# Patient Record
Sex: Female | Born: 1990 | Race: White | Hispanic: No | Marital: Single | Smoking: Never smoker
Health system: Southern US, Community
[De-identification: ages and names within clinical notes are randomized; demographics above are authoritative.]

## PROBLEM LIST (undated history)

## (undated) ENCOUNTER — Inpatient Hospital Stay (HOSPITAL_COMMUNITY): Payer: Self-pay

## (undated) DIAGNOSIS — O99820 Streptococcus B carrier state complicating pregnancy: Secondary | ICD-10-CM

## (undated) DIAGNOSIS — B009 Herpesviral infection, unspecified: Secondary | ICD-10-CM

## (undated) DIAGNOSIS — R51 Headache: Secondary | ICD-10-CM

## (undated) DIAGNOSIS — R519 Headache, unspecified: Secondary | ICD-10-CM

## (undated) HISTORY — PX: TUBAL LIGATION: SHX77

---
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
## 2010-03-18 ENCOUNTER — Ambulatory Visit: Payer: Self-pay | Admitting: Gynecology

## 2010-03-18 ENCOUNTER — Inpatient Hospital Stay (HOSPITAL_COMMUNITY): Admission: AD | Admit: 2010-03-18 | Discharge: 2010-03-18 | Payer: Self-pay | Admitting: Obstetrics & Gynecology

## 2010-06-21 ENCOUNTER — Observation Stay (HOSPITAL_COMMUNITY): Admission: AD | Admit: 2010-06-21 | Discharge: 2010-06-22 | Payer: Self-pay | Admitting: Obstetrics and Gynecology

## 2010-08-06 ENCOUNTER — Ambulatory Visit: Payer: Self-pay | Admitting: Obstetrics and Gynecology

## 2010-08-06 ENCOUNTER — Inpatient Hospital Stay (HOSPITAL_COMMUNITY): Admission: AD | Admit: 2010-08-06 | Discharge: 2010-08-08 | Payer: Self-pay | Admitting: Obstetrics and Gynecology

## 2010-08-06 ENCOUNTER — Encounter (INDEPENDENT_AMBULATORY_CARE_PROVIDER_SITE_OTHER): Payer: Self-pay | Admitting: Obstetrics and Gynecology

## 2010-08-16 ENCOUNTER — Inpatient Hospital Stay (HOSPITAL_COMMUNITY): Admission: AD | Admit: 2010-08-16 | Discharge: 2010-08-16 | Payer: Self-pay | Admitting: Obstetrics and Gynecology

## 2010-10-24 ENCOUNTER — Encounter: Payer: Self-pay | Admitting: Obstetrics and Gynecology

## 2010-12-14 LAB — COMPREHENSIVE METABOLIC PANEL
ALT: 27 U/L (ref 0–35)
Alkaline Phosphatase: 75 U/L (ref 39–117)
BUN: 6 mg/dL (ref 6–23)
Chloride: 105 mEq/L (ref 96–112)
GFR calc Af Amer: >60 mL/min (ref >60)
Potassium: 3.4 mEq/L — ABNORMAL LOW (ref 3.5–5.1)
Total Bilirubin: 0.1 mg/dL — ABNORMAL LOW (ref 0.3–1.2)

## 2010-12-14 LAB — CBC
HCT: 29.2 % — ABNORMAL LOW (ref 36.0–46.0)
Hemoglobin: 10.1 g/dL — ABNORMAL LOW (ref 12.0–15.0)
Hemoglobin: 11.4 g/dL — ABNORMAL LOW (ref 12.0–15.0)
Hemoglobin: 9.8 g/dL — ABNORMAL LOW (ref 12.0–15.0)
MCH: 31.3 pg (ref 26.0–34.0)
MCH: 31.6 pg (ref 26.0–34.0)
MCHC: 34.2 g/dL (ref 30.0–36.0)
MCV: 92.2 fL (ref 78.0–100.0)
MCV: 93.1 fL (ref 78.0–100.0)
Platelets: 282 10*3/uL (ref 150–400)
RBC: 3.12 MIL/uL — ABNORMAL LOW (ref 3.87–5.11)
RBC: 3.65 MIL/uL — ABNORMAL LOW (ref 3.87–5.11)
RDW: 12.6 % (ref 11.5–15.5)
WBC: 13.3 10*3/uL — ABNORMAL HIGH (ref 4.0–10.5)
WBC: 14.7 10*3/uL — ABNORMAL HIGH (ref 4.0–10.5)

## 2010-12-14 LAB — URINALYSIS, ROUTINE W REFLEX MICROSCOPIC
Bilirubin Urine: NEGATIVE
Ketones, ur: NEGATIVE mg/dL
Nitrite: NEGATIVE
Protein, ur: NEGATIVE mg/dL
Specific Gravity, Urine: 1.02 (ref 1.005–1.030)
Urobilinogen, UA: 0.2 mg/dL (ref 0.0–1.0)

## 2010-12-14 LAB — RPR: RPR Ser Ql: NONREACTIVE

## 2010-12-14 LAB — URIC ACID: Uric Acid, Serum: 3.4 mg/dL (ref 2.4–7.0)

## 2010-12-14 LAB — WET PREP, GENITAL

## 2010-12-14 LAB — URINE MICROSCOPIC-ADD ON

## 2010-12-16 LAB — DIFFERENTIAL
Eosinophils Absolute: 0.2 10*3/uL (ref 0.0–0.7)
Eosinophils Relative: 2 % (ref 0–5)
Lymphocytes Relative: 20 % (ref 12–46)
Monocytes Absolute: 0.8 10*3/uL (ref 0.1–1.0)
Neutro Abs: 9 10*3/uL — ABNORMAL HIGH (ref 1.7–7.7)

## 2010-12-16 LAB — WET PREP, GENITAL
Clue Cells Wet Prep HPF POC: NONE SEEN
Trich, Wet Prep: NONE SEEN

## 2010-12-16 LAB — CBC
HCT: 29.2 % — ABNORMAL LOW (ref 36.0–46.0)
MCHC: 34.3 g/dL (ref 30.0–36.0)
Platelets: 290 10*3/uL (ref 150–400)
RDW: 12.6 % (ref 11.5–15.5)
WBC: 12.6 10*3/uL — ABNORMAL HIGH (ref 4.0–10.5)

## 2010-12-16 LAB — HERPES SIMPLEX VIRUS CULTURE

## 2010-12-19 LAB — WET PREP, GENITAL

## 2010-12-20 LAB — URINE MICROSCOPIC-ADD ON

## 2010-12-20 LAB — GC/CHLAMYDIA PROBE AMP, GENITAL: GC Probe Amp, Genital: NEGATIVE

## 2010-12-20 LAB — CBC
HCT: 37.2 % (ref 36.0–46.0)
Platelets: 362 10*3/uL (ref 150–400)
WBC: 9.7 10*3/uL (ref 4.0–10.5)

## 2010-12-20 LAB — URINALYSIS, ROUTINE W REFLEX MICROSCOPIC
Glucose, UA: NEGATIVE mg/dL
Ketones, ur: NEGATIVE mg/dL
Leukocytes, UA: NEGATIVE
pH: 8 (ref 5.0–8.0)

## 2010-12-20 LAB — WET PREP, GENITAL
Trich, Wet Prep: NONE SEEN
Yeast Wet Prep HPF POC: NONE SEEN

## 2010-12-20 LAB — HCG, QUANTITATIVE, PREGNANCY: hCG, Beta Chain, Quant, S: 2867 m[IU]/mL — ABNORMAL HIGH (ref <5)

## 2010-12-20 LAB — POCT PREGNANCY, URINE: Preg Test, Ur: POSITIVE

## 2011-04-22 ENCOUNTER — Emergency Department (HOSPITAL_COMMUNITY): Payer: Self-pay

## 2011-04-22 ENCOUNTER — Emergency Department (HOSPITAL_COMMUNITY)
Admission: EM | Admit: 2011-04-22 | Discharge: 2011-04-22 | Disposition: A | Discharge status: Home / Self Care | Payer: Self-pay | Attending: Emergency Medicine | Admitting: Emergency Medicine

## 2011-04-22 DIAGNOSIS — R10814 Left lower quadrant abdominal tenderness: Secondary | ICD-10-CM | POA: Insufficient documentation

## 2011-04-22 DIAGNOSIS — R1032 Left lower quadrant pain: Secondary | ICD-10-CM | POA: Insufficient documentation

## 2011-04-22 LAB — URINALYSIS, ROUTINE W REFLEX MICROSCOPIC
Glucose, UA: NEGATIVE mg/dL
Specific Gravity, Urine: 1.028 (ref 1.005–1.030)
pH: 6 (ref 5.0–8.0)

## 2011-04-22 LAB — URINE MICROSCOPIC-ADD ON

## 2012-01-29 ENCOUNTER — Emergency Department (HOSPITAL_COMMUNITY)
Admission: EM | Admit: 2012-01-29 | Discharge: 2012-01-29 | Disposition: A | Discharge status: Home / Self Care | Payer: Commercial Managed Care - PPO | Attending: Emergency Medicine | Admitting: Emergency Medicine

## 2012-01-29 ENCOUNTER — Encounter (HOSPITAL_COMMUNITY): Payer: Self-pay | Admitting: Emergency Medicine

## 2012-01-29 DIAGNOSIS — R197 Diarrhea, unspecified: Secondary | ICD-10-CM | POA: Insufficient documentation

## 2012-01-29 DIAGNOSIS — R109 Unspecified abdominal pain: Secondary | ICD-10-CM | POA: Insufficient documentation

## 2012-01-29 DIAGNOSIS — K5289 Other specified noninfective gastroenteritis and colitis: Secondary | ICD-10-CM | POA: Insufficient documentation

## 2012-01-29 DIAGNOSIS — K529 Noninfective gastroenteritis and colitis, unspecified: Secondary | ICD-10-CM

## 2012-01-29 LAB — POCT I-STAT, CHEM 8
BUN: 12 mg/dL (ref 6–23)
Calcium, Ion: 1.19 mmol/L (ref 1.12–1.32)
Chloride: 106 mEq/L (ref 96–112)
Creatinine, Ser: 0.8 mg/dL (ref 0.50–1.10)
Sodium: 141 mEq/L (ref 135–145)
TCO2: 23 mmol/L (ref 0–100)

## 2012-01-29 LAB — URINALYSIS, ROUTINE W REFLEX MICROSCOPIC
Glucose, UA: NEGATIVE mg/dL
Protein, ur: NEGATIVE mg/dL

## 2012-01-29 LAB — URINE MICROSCOPIC-ADD ON

## 2012-01-29 MED ORDER — ONDANSETRON HCL 4 MG/2ML IJ SOLN
4.0000 mg | Freq: Once | INTRAMUSCULAR | Status: AC
  Administered 2012-01-29: 4 mg via INTRAVENOUS
  Filled 2012-01-29: qty 2

## 2012-01-29 MED ORDER — ONDANSETRON 8 MG PO TBDP
8.0000 mg | ORAL_TABLET | Freq: Once | ORAL | Status: AC
  Administered 2012-01-29: 8 mg via ORAL
  Filled 2012-01-29: qty 1

## 2012-01-29 MED ORDER — PANTOPRAZOLE SODIUM 40 MG IV SOLR
40.0000 mg | Freq: Once | INTRAVENOUS | Status: AC
Start: 2012-01-29 — End: 2012-01-29
  Administered 2012-01-29: 40 mg via INTRAVENOUS
  Filled 2012-01-29: qty 40

## 2012-01-29 MED ORDER — SODIUM CHLORIDE 0.9 % IV BOLUS (SEPSIS)
1000.0000 mL | Freq: Once | INTRAVENOUS | Status: AC
  Administered 2012-01-29: 1000 mL via INTRAVENOUS

## 2012-01-29 MED ORDER — SODIUM CHLORIDE 0.9 % IV SOLN: INTRAVENOUS | Status: DC

## 2012-01-29 MED ORDER — ONDANSETRON 8 MG PO TBDP: 8.0000 mg | ORAL_TABLET | Freq: Three times a day (TID) | ORAL | Status: AC | PRN

## 2012-01-29 MED ORDER — FENTANYL CITRATE 0.05 MG/ML IJ SOLN
50.0000 ug | Freq: Once | INTRAMUSCULAR | Status: AC
  Administered 2012-01-29: 50 ug via INTRAVENOUS
  Filled 2012-01-29: qty 2

## 2012-01-29 MED ORDER — TRAMADOL HCL 50 MG PO TABS: 50.0000 mg | ORAL_TABLET | Freq: Four times a day (QID) | ORAL | Status: AC | PRN

## 2012-01-29 MED ORDER — GI COCKTAIL ~~LOC~~
30.0000 mL | Freq: Once | ORAL | Status: AC
  Administered 2012-01-29: 30 mL via ORAL
  Filled 2012-01-29: qty 30

## 2012-01-29 NOTE — ED Provider Notes (Signed)
History     CSN: [id number]  Arrival date & time 01/29/12  0434   First MD Initiated Contact with Patient 01/29/12 0607      6:27 AM HPI Alicia Singleton is a 21 y.o. female complaining of NVD that began 3 days ago. Reports within the last 24 hours has been unable to tolerate any PO. States this morning began having associated mild LUQ abdominal pain. Denies fevers, dysuria, hematuria, frequency, urgency, hematochezia, vaginal discharge, back pain, headache. Reports her child has similar symptoms.   Patient is a 21 y.o. female presenting with vomiting. The history is provided by the patient.  Emesis  This is a new problem. The current episode started 2 days ago. The problem has been gradually worsening. The emesis has an appearance of stomach contents and bilious material. There has been no fever. Associated symptoms include abdominal pain and diarrhea. Pertinent negatives include no chills, no cough, no fever, no headaches, no myalgias and no URI. Risk factors include ill contacts.    History reviewed. No pertinent past medical history.  History reviewed. No pertinent past surgical history.  Family History  Problem Relation Age of Onset  . Hyperlipidemia Father   . Diabetes Other   . Hyperlipidemia Other   . Heart failure Other     History  Substance Use Topics  . Smoking status: Not on file  . Smokeless tobacco: Not on file  . Alcohol Use:     OB History    Grav Para Term Preterm Abortions TAB SAB Ect Mult Living                  Review of Systems  Constitutional: Negative for fever and chills.  Respiratory: Negative for cough and shortness of breath.   Cardiovascular: Negative for chest pain.  Gastrointestinal: Positive for vomiting, abdominal pain and diarrhea. Negative for nausea, constipation and blood in stool.  Genitourinary: Negative for dysuria, urgency, frequency, hematuria, flank pain, vaginal discharge and vaginal pain.  Musculoskeletal: Negative for  myalgias and back pain.  Neurological: Negative for headaches.  All other systems reviewed and are negative.    Allergies  Review of patient's allergies indicates no known allergies.  Home Medications  No current outpatient prescriptions on file.  BP 126/81  Temp(Src) 98.2 F (36.8 C) (Oral)  Resp 18  SpO2 96%  Physical Exam  Vitals reviewed. Constitutional: She is oriented to person, place, and time. Vital signs are normal. She appears well-developed and well-nourished.  HENT:  Head: Normocephalic and atraumatic.  Mouth/Throat: Oropharynx is clear and moist. No oropharyngeal exudate.  Eyes: Conjunctivae are normal. Pupils are equal, round, and reactive to light.  Neck: Normal range of motion. Neck supple.  Cardiovascular: Normal rate, regular rhythm and normal heart sounds.  Exam reveals no friction rub.   No murmur heard. Pulmonary/Chest: Effort normal and breath sounds normal. She has no wheezes. She has no rhonchi. She has no rales. She exhibits no tenderness.  Abdominal: Soft. Bowel sounds are normal. She exhibits no distension and no mass. There is no tenderness. There is no rebound and no guarding.  Neurological: She is alert and oriented to person, place, and time.  Skin: Skin is warm and dry. No rash noted. No erythema. No pallor.  Psychiatric: She has a normal mood and affect. Her behavior is normal.    ED Course  Procedures  Results for orders placed during the hospital encounter of 01/29/12  PREGNANCY, URINE      Component Value  Range   Preg Test, Ur NEGATIVE  NEGATIVE   URINALYSIS, ROUTINE W REFLEX MICROSCOPIC      Component Value Range   Color, Urine YELLOW  YELLOW    APPearance CLOUDY (*) CLEAR    Specific Gravity, Urine 1.029  1.005 - 1.030    pH 6.5  5.0 - 8.0    Glucose, UA NEGATIVE  NEGATIVE (mg/dL)   Hgb urine dipstick TRACE (*) NEGATIVE    Bilirubin Urine SMALL (*) NEGATIVE    Ketones, ur TRACE (*) NEGATIVE (mg/dL)   Protein, ur NEGATIVE   NEGATIVE (mg/dL)   Urobilinogen, UA 1.0  0.0 - 1.0 (mg/dL)   Nitrite NEGATIVE  NEGATIVE    Leukocytes, UA SMALL (*) NEGATIVE   POCT I-STAT, CHEM 8      Component Value Range   Sodium 141  135 - 145 (mEq/L)   Potassium 3.3 (*) 3.5 - 5.1 (mEq/L)   Chloride 106  96 - 112 (mEq/L)   BUN 12  6 - 23 (mg/dL)   Creatinine, Ser 4.09  0.50 - 1.10 (mg/dL)   Glucose, Bld 93  70 - 99 (mg/dL)   Calcium, Ion 8.11  9.14 - 1.32 (mmol/L)   TCO2 23  0 - 100 (mmol/L)   Hemoglobin 14.3  12.0 - 15.0 (g/dL)   HCT 78.2  95.6 - 21.3 (%)  URINE MICROSCOPIC-ADD ON      Component Value Range   Squamous Epithelial / LPF MANY (*) RARE    WBC, UA 11-20  <3 (WBC/hpf)   Bacteria, UA MANY (*) RARE    Urine-Other MUCOUS PRESENT      MDM  9:00 AM Patient reexamined. Is able to tolerate PO fluids. Patient still complaining of left upper quadrant pain which was not relieved by Protonix and GI cocktail. Will give of fentanyl. Patient does however continue the nontender with palpation. Discussed with patient and mother that we'll discharge her with antiemetics and pain medication. As well as instructions for diet. Patient likely has a viral gastroenteritis that she got from her child. Advised return for worsening symptoms such as intractable nausea vomiting or worsening pain. Patient voices understanding of discharge       Thomasene Lot, Cordelia Poche 01/29/12 0865

## 2012-01-29 NOTE — ED Notes (Addendum)
Pt c/o NVD that began Saturday morning. Pt states she has been unable to keep and fluids or solid down. Pt also c/o left sided abdominal pain that began at 230am this morning. Pt states she attempted to take OTC meds but couldn't keep them down.

## 2012-01-29 NOTE — Discharge Instructions (Signed)
B.R.A.T. Diet Your doctor has recommended the B.R.A.T. diet for you or your child until the condition improves. This is often used to help control diarrhea and vomiting symptoms. If you or your child can tolerate clear liquids, you may have:  Bananas.   Rice.   Applesauce.   Toast (and other simple starches such as crackers, potatoes, noodles).  Be sure to avoid dairy products, meats, and fatty foods until symptoms are better. Fruit juices such as apple, grape, and prune juice can make diarrhea worse. Avoid these. Continue this diet for 2 days or as instructed by your caregiver. Document Released: 09/19/2005 Document Revised: 09/08/2011 Document Reviewed: 03/08/2007 [employer] Patient Information 2012 [employer].Viral Gastroenteritis Viral gastroenteritis is also known as stomach flu. This condition affects the stomach and intestinal tract. It can cause sudden diarrhea and vomiting. The illness typically lasts 3 to 8 days. Most people develop an immune response that eventually gets rid of the virus. While this natural response develops, the virus can make you quite ill. CAUSES  Many different viruses can cause gastroenteritis, such as rotavirus or noroviruses. You can catch one of these viruses by consuming contaminated food or water. You may also catch a virus by sharing utensils or other personal items with an infected person or by touching a contaminated surface. SYMPTOMS  The most common symptoms are diarrhea and vomiting. These problems can cause a severe loss of body fluids (dehydration) and a body salt (electrolyte) imbalance. Other symptoms may include:  Fever.   Headache.   Fatigue.   Abdominal pain.  DIAGNOSIS  Your caregiver can usually diagnose viral gastroenteritis based on your symptoms and a physical exam. A stool sample may also be taken to test for the presence of viruses or other infections. TREATMENT  This illness typically goes away on its own. Treatments are aimed  at rehydration. The most serious cases of viral gastroenteritis involve vomiting so severely that you are not able to keep fluids down. In these cases, fluids must be given through an intravenous line (IV). HOME CARE INSTRUCTIONS   Drink enough fluids to keep your urine clear or pale yellow. Drink small amounts of fluids frequently and increase the amounts as tolerated.   Ask your caregiver for specific rehydration instructions.   Avoid:   Foods high in sugar.   Alcohol.   Carbonated drinks.   Tobacco.   Juice.   Caffeine drinks.   Extremely hot or cold fluids.   Fatty, greasy foods.   Too much intake of anything at one time.   Dairy products until 24 to 48 hours after diarrhea stops.   You may consume probiotics. Probiotics are active cultures of beneficial bacteria. They may lessen the amount and number of diarrheal stools in adults. Probiotics can be found in yogurt with active cultures and in supplements.   Wash your hands well to avoid spreading the virus.   Only take over-the-counter or prescription medicines for pain, discomfort, or fever as directed by your caregiver. Do not give aspirin to children. Antidiarrheal medicines are not recommended.   Ask your caregiver if you should continue to take your regular prescribed and over-the-counter medicines.   Keep all follow-up appointments as directed by your caregiver.  SEEK IMMEDIATE MEDICAL CARE IF:   You are unable to keep fluids down.   You do not urinate at least once every 6 to 8 hours.   You develop shortness of breath.   You notice blood in your stool or vomit. This may   look like coffee grounds.   You have abdominal pain that increases or is concentrated in one small area (localized).   You have persistent vomiting or diarrhea.   You have a fever.   The patient is a child younger than 3 months, and he or she has a fever.   The patient is a child older than 3 months, and he or she has a fever and  persistent symptoms.   The patient is a child older than 3 months, and he or she has a fever and symptoms suddenly get worse.   The patient is a baby, and he or she has no tears when crying.  MAKE SURE YOU:   Understand these instructions.   Will watch your condition.   Will get help right away if you are not doing well or get worse.  Document Released: 09/19/2005 Document Revised: 09/08/2011 Document Reviewed: 07/06/2011 [facility] Patient Information 2012 [place], [place].Viral Gastroenteritis Viral gastroenteritis is also known as stomach flu. This condition affects the stomach and intestinal tract. It can cause sudden diarrhea and vomiting. The illness typically lasts 3 to 8 days. Most people develop an immune response that eventually gets rid of the virus. While this natural response develops, the virus can make you quite ill. CAUSES  Many different viruses can cause gastroenteritis, such as rotavirus or noroviruses. You can catch one of these viruses by consuming contaminated food or water. You may also catch a virus by sharing utensils or other personal items with an infected person or by touching a contaminated surface. SYMPTOMS  The most common symptoms are diarrhea and vomiting. These problems can cause a severe loss of body fluids (dehydration) and a body salt (electrolyte) imbalance. Other symptoms may include:  Fever.   Headache.   Fatigue.   Abdominal pain.  DIAGNOSIS  Your caregiver can usually diagnose viral gastroenteritis based on your symptoms and a physical exam. A stool sample may also be taken to test for the presence of viruses or other infections. TREATMENT  This illness typically goes away on its own. Treatments are aimed at rehydration. The most serious cases of viral gastroenteritis involve vomiting so severely that you are not able to keep fluids down. In these cases, fluids must be given through an intravenous line (IV). HOME CARE INSTRUCTIONS   Drink enough  fluids to keep your urine clear or pale yellow. Drink small amounts of fluids frequently and increase the amounts as tolerated.   Ask your caregiver for specific rehydration instructions.   Avoid:   Foods high in sugar.   Alcohol.   Carbonated drinks.   Tobacco.   Juice.   Caffeine drinks.   Extremely hot or cold fluids.   Fatty, greasy foods.   Too much intake of anything at one time.   Dairy products until 24 to 48 hours after diarrhea stops.   You may consume probiotics. Probiotics are active cultures of beneficial bacteria. They may lessen the amount and number of diarrheal stools in adults. Probiotics can be found in yogurt with active cultures and in supplements.   Wash your hands well to avoid spreading the virus.   Only take over-the-counter or prescription medicines for pain, discomfort, or fever as directed by your caregiver. Do not give aspirin to children. Antidiarrheal medicines are not recommended.   Ask your caregiver if you should continue to take your regular prescribed and over-the-counter medicines.   Keep all follow-up appointments as directed by your caregiver.  SEEK IMMEDIATE MEDICAL CARE IF:  You are unable to keep fluids down.   You do not urinate at least once every 6 to 8 hours.   You develop shortness of breath.   You notice blood in your stool or vomit. This may look like coffee grounds.   You have abdominal pain that increases or is concentrated in one small area (localized).   You have persistent vomiting or diarrhea.   You have a fever.   The patient is a child younger than 3 months, and he or she has a fever.   The patient is a child older than 3 months, and he or she has a fever and persistent symptoms.   The patient is a child older than 3 months, and he or she has a fever and symptoms suddenly get worse.   The patient is a baby, and he or she has no tears when crying.  MAKE SURE YOU:   Understand these instructions.    Will watch your condition.   Will get help right away if you are not doing well or get worse.  Document Released: 09/19/2005 Document Revised: 09/08/2011 Document Reviewed: 07/06/2011 [facility] Patient Information 2012 [place], [place].

## 2012-01-30 NOTE — ED Provider Notes (Signed)
Medical screening examination/treatment/procedure(s) were performed by non-physician practitioner and as supervising physician I was immediately available for consultation/collaboration.   Sunnie Nielsen, MD 01/30/12 [phone]

## 2014-03-09 ENCOUNTER — Encounter (HOSPITAL_COMMUNITY): Payer: Self-pay | Admitting: Emergency Medicine

## 2014-03-09 ENCOUNTER — Emergency Department (HOSPITAL_COMMUNITY)
Admission: EM | Admit: 2014-03-09 | Discharge: 2014-03-09 | Disposition: A | Discharge status: Home / Self Care | Payer: Commercial Managed Care - PPO | Attending: Emergency Medicine | Admitting: Emergency Medicine

## 2014-03-09 DIAGNOSIS — Y939 Activity, unspecified: Secondary | ICD-10-CM | POA: Insufficient documentation

## 2014-03-09 DIAGNOSIS — Y9289 Other specified places as the place of occurrence of the external cause: Secondary | ICD-10-CM | POA: Insufficient documentation

## 2014-03-09 DIAGNOSIS — IMO0002 Reserved for concepts with insufficient information to code with codable children: Secondary | ICD-10-CM | POA: Insufficient documentation

## 2014-03-09 DIAGNOSIS — Z23 Encounter for immunization: Secondary | ICD-10-CM | POA: Insufficient documentation

## 2014-03-09 DIAGNOSIS — S41109A Unspecified open wound of unspecified upper arm, initial encounter: Secondary | ICD-10-CM | POA: Insufficient documentation

## 2014-03-09 DIAGNOSIS — S41112A Laceration without foreign body of left upper arm, initial encounter: Secondary | ICD-10-CM

## 2014-03-09 MED ORDER — TETANUS-DIPHTH-ACELL PERTUSSIS 5-2.5-18.5 LF-MCG/0.5 IM SUSP
0.5000 mL | Freq: Once | INTRAMUSCULAR | Status: AC
  Administered 2014-03-09: 0.5 mL via INTRAMUSCULAR
  Filled 2014-03-09: qty 0.5

## 2014-03-09 MED ORDER — ACETAMINOPHEN 500 MG PO TABS
1000.0000 mg | ORAL_TABLET | Freq: Once | ORAL | Status: AC
  Administered 2014-03-09: 1000 mg via ORAL
  Filled 2014-03-09: qty 2
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
## 2014-03-09 NOTE — Discharge Instructions (Signed)
Keep wound clean and dry. He can shower as normal but no soaking in hot tubs, bath tubs or pools until healed. Apply topical antibiotics once a day. Return for fevers, posturing, streaking redness. Take Tylenol and use ice for pain.  If you were given medicines take as directed.  If you are on coumadin or contraceptives realize their levels and effectiveness is altered by many different medicines.  If you have any reaction (rash, tongues swelling, other) to the medicines stop taking and see a physician.   Please follow up as directed and return to the ER or see a physician for new or worsening symptoms.  Thank you. Filed Vitals:   03/09/14 0205 03/09/14 0306  BP: 128/77 112/79  Pulse: 83 80  Temp: 98.6 F (37 C)   TempSrc: Oral   Resp: 18 18  Weight: 144 lb 1 oz (65.346 kg)   SpO2: 98%    Laceration Care, Adult A laceration is a cut that goes through all layers of the skin. The cut goes into the tissue beneath the skin. HOME CARE For stitches (sutures) or staples:  Keep the cut clean and dry.  If you have a bandage (dressing), change it at least once a day. Change the bandage if it gets wet or dirty, or as told by your doctor.  Wash the cut with soap and water 2 times a day. Rinse the cut with water. Pat it dry with a clean towel.  Put a thin layer of medicated cream on the cut as told by your doctor.  You may shower after the first 24 hours. Do not soak the cut in water until the stitches are removed.  Only take medicines as told by your doctor.  Have your stitches or staples removed as told by your doctor. For skin adhesive strips:  Keep the cut clean and dry.  Do not get the strips wet. You may take a bath, but be careful to keep the cut dry.  If the cut gets wet, pat it dry with a clean towel.  The strips will fall off on their own. Do not remove the strips that are still stuck to the cut. For wound glue:  You may shower or take baths. Do not soak or scrub the cut.  Do not swim. Avoid heavy sweating until the glue falls off on its own. After a shower or bath, pat the cut dry with a clean towel.  Do not put medicine on your cut until the glue falls off.  If you have a bandage, do not put tape over the glue.  Avoid lots of sunlight or tanning lamps until the glue falls off. Put sunscreen on the cut for the first year to reduce your scar.  The glue will fall off on its own. Do not pick at the glue. You may need a tetanus shot if:  You cannot remember when you had your last tetanus shot.  You have never had a tetanus shot. If you need a tetanus shot and you choose not to have one, you may get tetanus. Sickness from tetanus can be serious. GET HELP RIGHT AWAY IF:   Your pain does not get better with medicine.  Your arm, hand, leg, or foot loses feeling (numbness) or changes color.  Your cut is bleeding.  Your joint feels weak, or you cannot use your joint.  You have painful lumps on your body.  Your cut is red, puffy (swollen), or painful.  You have a red  line on the skin near the cut.  You have yellowish-white fluid (pus) coming from the cut.  You have a fever.  You have a bad smell coming from the cut or bandage.  Your cut breaks open before or after stitches are removed.  You notice something coming out of the cut, such as wood or glass.  You cannot move a finger or toe. MAKE SURE YOU:   Understand these instructions.  Will watch your condition.  Will get help right away if you are not doing well or get worse. Document Released: 03/07/2008 Document Revised: 12/12/2011 Document Reviewed: 03/15/2011 [facility] Patient Information 2014 [place].

## 2014-03-09 NOTE — ED Notes (Signed)
MD at bedside. 

## 2014-03-09 NOTE — ED Notes (Signed)
Suture cart at bedside 
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
## 2014-03-25 ENCOUNTER — Other Ambulatory Visit: Payer: Self-pay | Admitting: Obstetrics and Gynecology

## 2014-03-26 LAB — CYTOLOGY - PAP

## 2014-10-03 NOTE — L&D Delivery Note (Signed)
Vaginal Delivery Note The pt utilized an epidural as pain management.   Artificial rupture of membranes today, at 1859, light meconium.  GBS was positive, Ampicillin x 1 doses were given.  Cervical dilation was complete at  1852.  NICHD Category 2.    Pushing with guidance began at 1904  .   After  15 minutes of pushing the head, shoulders and the body of a viable female infant "Malichi" delivered spontaneously with maternal effort in the LOA position at 1919     With vigorous tone and spontaneous cry, the infant was placed on moms abd.  After the umbilical cord was clamped it was cut by the FOB, then cord blood was obtained for evaluation.  Spontaneous delivery of a intact placenta with a 3 vessel cord via Shultz at [phone] .   Episiotomy: None   The vulva, perineum, vaginal vault, rectum and cervix were inspected and revealed a second degree perineal, repaired using a 3-0 vicryl on a CT needle and a superficial labial which was repaired using a 3-0 vicryl on a SH.  Lidocaine was not used, the epidural was sufficient for the repair.   The rectum sphincter intact after the repair.   Patient tolerated repair well.   Postpartum pitocin as ordered.  Uterus boggy immediately after delivery of placenta - fundal massage, 1000 mcg of Cytotec PR, and methergine  provided   EBL 300, Pt hemodynamically stable.   Sponge, laps and needle count correct and verified with the primary care nurse.  Attending MD present for delivery and available at all times.    Routine postpartum orders   Mother desires Nexplanon for contraception  Mom plans to breastfeed  Infant to have outpatient circumcision   Placenta to pathology: NO     Cord Gases sent to lab: NO Cord blood sent to lab: YES   APGARS:  9 at 1 minute and 9 at 5 minutes Weight:.    Both mom and baby were left in stable condition, baby skin to skin.     Alphonzo Severanceachel Shaylynn Nulty, CNM, MSN 08/04/2015. 8:28 PM

## 2014-11-12 ENCOUNTER — Encounter (HOSPITAL_COMMUNITY): Payer: Self-pay | Admitting: Emergency Medicine

## 2014-11-12 ENCOUNTER — Emergency Department (INDEPENDENT_AMBULATORY_CARE_PROVIDER_SITE_OTHER)
Admission: EM | Admit: 2014-11-12 | Discharge: 2014-11-12 | Disposition: A | Discharge status: Home / Self Care | Payer: 59 | Source: Home / Self Care | Attending: Family Medicine | Admitting: Family Medicine

## 2014-11-12 DIAGNOSIS — R110 Nausea: Secondary | ICD-10-CM

## 2014-11-12 DIAGNOSIS — A084 Viral intestinal infection, unspecified: Secondary | ICD-10-CM

## 2014-11-12 LAB — POCT PREGNANCY, URINE: PREG TEST UR: NEGATIVE

## 2014-11-12 LAB — POCT URINALYSIS DIP (DEVICE)
BILIRUBIN URINE: NEGATIVE
GLUCOSE, UA: NEGATIVE mg/dL
Ketones, ur: NEGATIVE mg/dL
NITRITE: NEGATIVE
Protein, ur: 30 mg/dL — AB
Specific Gravity, Urine: 1.025 (ref 1.005–1.030)
Urobilinogen, UA: 0.2 mg/dL (ref 0.0–1.0)
pH: 7 (ref 5.0–8.0)

## 2014-11-12 NOTE — ED Notes (Signed)
Pt reports she has been nauseas since 02/05 Reports she was vomiting and having diarrhea over the weekend Had 2 loose stools yest and last vomited Sunday night LMP = ?? Alert, no signs of acute distress.

## 2014-11-12 NOTE — Discharge Instructions (Signed)
Food Choices to Help Relieve Diarrhea When you have diarrhea, the foods you eat and your eating habits are very important. Choosing the right foods and drinks can help relieve diarrhea. Also, because diarrhea can last up to 7 days, you need to replace lost fluids and electrolytes (such as sodium, potassium, and chloride) in order to help prevent dehydration.  WHAT GENERAL GUIDELINES DO I NEED TO FOLLOW?  Slowly drink 1 cup (8 oz) of fluid for each episode of diarrhea. If you are getting enough fluid, your urine will be clear or pale yellow.  Eat starchy foods. Some good choices include white rice, white toast, pasta, low-fiber cereal, baked potatoes (without the skin), saltine crackers, and bagels.  Avoid large servings of any cooked vegetables.  Limit fruit to two servings per day. A serving is  cup or 1 small piece.  Choose foods with less than 2 g of fiber per serving.  Limit fats to less than 8 tsp (38 g) per day.  Avoid fried foods.  Eat foods that have probiotics in them. Probiotics can be found in certain dairy products.  Avoid foods and beverages that may increase the speed at which food moves through the stomach and intestines (gastrointestinal tract). Things to avoid include:  High-fiber foods, such as dried fruit, raw fruits and vegetables, nuts, seeds, and whole grain foods.  Spicy foods and high-fat foods.  Foods and beverages sweetened with high-fructose corn syrup, honey, or sugar alcohols such as xylitol, sorbitol, and mannitol. WHAT FOODS ARE RECOMMENDED? Grains White rice. White, [place], or pita breads (fresh or toasted), including plain rolls, buns, or bagels. White pasta. Saltine, soda, or graham crackers. Pretzels. Low-fiber cereal. Cooked cereals made with water (such as cornmeal, farina, or cream cereals). Plain muffins. Matzo. Melba toast. Zwieback.  Vegetables Potatoes (without the skin). Strained tomato and vegetable juices. Most well-cooked and canned  vegetables without seeds. Tender lettuce. Fruits Cooked or canned applesauce, apricots, cherries, fruit cocktail, grapefruit, peaches, pears, or plums. Fresh bananas, apples without skin, cherries, grapes, cantaloupe, grapefruit, peaches, oranges, or plums.  Meat and Other Protein Products Baked or boiled chicken. Eggs. Tofu. Fish. Seafood. Smooth peanut butter. Ground or well-cooked tender beef, ham, veal, lamb, pork, or poultry.  Dairy Plain yogurt, kefir, and unsweetened liquid yogurt. Lactose-free milk, buttermilk, or soy milk. Plain hard cheese. Beverages Sport drinks. Clear broths. Diluted fruit juices (except prune). Regular, caffeine-free sodas such as ginger ale. Water. Decaffeinated teas. Oral rehydration solutions. Sugar-free beverages not sweetened with sugar alcohols. Other Bouillon, broth, or soups made from recommended foods.  The items listed above may not be a complete list of recommended foods or beverages. Contact your dietitian for more options. WHAT FOODS ARE NOT RECOMMENDED? Grains Whole grain, whole wheat, bran, or rye breads, rolls, pastas, crackers, and cereals. Wild or brown rice. Cereals that contain more than 2 g of fiber per serving. Corn tortillas or taco shells. Cooked or dry oatmeal. Granola. Popcorn. Vegetables Raw vegetables. Cabbage, broccoli, Brussels sprouts, artichokes, baked beans, beet greens, corn, kale, legumes, peas, sweet potatoes, and yams. Potato skins. Cooked spinach and cabbage. Fruits Dried fruit, including raisins and dates. Raw fruits. Stewed or dried prunes. Fresh apples with skin, apricots, mangoes, pears, raspberries, and strawberries.  Meat and Other Protein Products Chunky peanut butter. Nuts and seeds. Beans and lentils. Tomasa BlaseBacon.  Dairy High-fat cheeses. Milk, chocolate milk, and beverages made with milk, such as milk shakes. Cream. Ice cream. Sweets and Desserts Sweet rolls, doughnuts, and sweet breads. Pancakes  and waffles. Fats and  Oils Butter. Cream sauces. Margarine. Salad oils. Plain salad dressings. Olives. Avocados.  Beverages Caffeinated beverages (such as coffee, tea, soda, or energy drinks). Alcoholic beverages. Fruit juices with pulp. Prune juice. Soft drinks sweetened with high-fructose corn syrup or sugar alcohols. Other Coconut. Hot sauce. Chili powder. Mayonnaise. Gravy. Cream-based or milk-based soups.  The items listed above may not be a complete list of foods and beverages to avoid. Contact your dietitian for more information. WHAT SHOULD I DO IF I BECOME DEHYDRATED? Diarrhea can sometimes lead to dehydration. Signs of dehydration include dark urine and dry mouth and skin. If you think you are dehydrated, you should rehydrate with an oral rehydration solution. These solutions can be purchased at pharmacies, retail stores, or online.  Drink -1 cup (120-240 mL) of oral rehydration solution each time you have an episode of diarrhea. If drinking this amount makes your diarrhea worse, try drinking smaller amounts more often. For example, drink 1-3 tsp (5-15 mL) every 5-10 minutes.  A general rule for staying hydrated is to drink 1-2 L of fluid per day. Talk to your health care provider about the specific amount you should be drinking each day. Drink enough fluids to keep your urine clear or pale yellow. Document Released: 12/10/2003 Document Revised: 09/24/2013 Document Reviewed: 08/12/2013 [facility] Patient Information 2015 [place], [place]. This information is not intended to replace advice given to you by your health care provider. Make sure you discuss any questions you have with your health care provider.  Viral Gastroenteritis Viral gastroenteritis is also known as stomach flu. This condition affects the stomach and intestinal tract. It can cause sudden diarrhea and vomiting. The illness typically lasts 3 to 8 days. Most people develop an immune response that eventually gets rid of the virus. While this natural  response develops, the virus can make you quite ill. CAUSES  Many different viruses can cause gastroenteritis, such as rotavirus or noroviruses. You can catch one of these viruses by consuming contaminated food or water. You may also catch a virus by sharing utensils or other personal items with an infected person or by touching a contaminated surface. SYMPTOMS  The most common symptoms are diarrhea and vomiting. These problems can cause a severe loss of body fluids (dehydration) and a body salt (electrolyte) imbalance. Other symptoms may include:  Fever.  Headache.  Fatigue.  Abdominal pain. DIAGNOSIS  Your caregiver can usually diagnose viral gastroenteritis based on your symptoms and a physical exam. A stool sample may also be taken to test for the presence of viruses or other infections. TREATMENT  This illness typically goes away on its own. Treatments are aimed at rehydration. The most serious cases of viral gastroenteritis involve vomiting so severely that you are not able to keep fluids down. In these cases, fluids must be given through an intravenous line (IV). HOME CARE INSTRUCTIONS   Drink enough fluids to keep your urine clear or pale yellow. Drink small amounts of fluids frequently and increase the amounts as tolerated.  Ask your caregiver for specific rehydration instructions.  Avoid:  Foods high in sugar.  Alcohol.  Carbonated drinks.  Tobacco.  Juice.  Caffeine drinks.  Extremely hot or cold fluids.  Fatty, greasy foods.  Too much intake of anything at one time.  Dairy products until 24 to 48 hours after diarrhea stops.  You may consume probiotics. Probiotics are active cultures of beneficial bacteria. They may lessen the amount and number of diarrheal stools in adults.  Probiotics can be found in yogurt with active cultures and in supplements.  Wash your hands well to avoid spreading the virus.  Only take over-the-counter or prescription medicines for  pain, discomfort, or fever as directed by your caregiver. Do not give aspirin to children. Antidiarrheal medicines are not recommended.  Ask your caregiver if you should continue to take your regular prescribed and over-the-counter medicines.  Keep all follow-up appointments as directed by your caregiver. SEEK IMMEDIATE MEDICAL CARE IF:   You are unable to keep fluids down.  You do not urinate at least once every 6 to 8 hours.  You develop shortness of breath.  You notice blood in your stool or vomit. This may look like coffee grounds.  You have abdominal pain that increases or is concentrated in one small area (localized).  You have persistent vomiting or diarrhea.  You have a fever.  The patient is a child younger than 3 months, and he or she has a fever.  The patient is a child older than 3 months, and he or she has a fever and persistent symptoms.  The patient is a child older than 3 months, and he or she has a fever and symptoms suddenly get worse.  The patient is a baby, and he or she has no tears when crying. MAKE SURE YOU:   Understand these instructions.  Will watch your condition.  Will get help right away if you are not doing well or get worse. Document Released: 09/19/2005 Document Revised: 12/12/2011 Document Reviewed: 07/06/2011 [facility] Patient Information 2015 [place], [place]. This information is not intended to replace advice given to you by your health care provider. Make sure you discuss any questions you have with your health care provider.  Nausea, Adult Nausea is the feeling that you have an upset stomach or have to vomit. Nausea by itself is not likely a serious concern, but it may be an early sign of more serious medical problems. As nausea gets worse, it can lead to vomiting. If vomiting develops, there is the risk of dehydration.  CAUSES   Viral infections.  Food poisoning.  Medicines.  Pregnancy.  Motion sickness.  Migraine  headaches.  Emotional distress.  Severe pain from any source.  Alcohol intoxication. HOME CARE INSTRUCTIONS  Get plenty of rest.  Ask your caregiver about specific rehydration instructions.  Eat small amounts of food and sip liquids more often.  Take all medicines as told by your caregiver. SEEK MEDICAL CARE IF:  You have not improved after 2 days, or you get worse.  You have a headache. SEEK IMMEDIATE MEDICAL CARE IF:   You have a fever.  You faint.  You keep vomiting or have blood in your vomit.  You are extremely weak or dehydrated.  You have dark or bloody stools.  You have severe chest or abdominal pain. MAKE SURE YOU:  Understand these instructions.  Will watch your condition.  Will get help right away if you are not doing well or get worse. Document Released: 10/27/2004 Document Revised: 06/13/2012 Document Reviewed: 06/01/2011 [facility] Patient Information 2015 [place], [place]. This information is not intended to replace advice given to you by your health care provider. Make sure you discuss any questions you have with your health care provider.  Nausea and Vomiting Nausea is a sick feeling that often comes before throwing up (vomiting). Vomiting is a reflex where stomach contents come out of your mouth. Vomiting can cause severe loss of body fluids (dehydration). Children and elderly  adults can become dehydrated quickly, especially if they also have diarrhea. Nausea and vomiting are symptoms of a condition or disease. It is important to find the cause of your symptoms. CAUSES   Direct irritation of the stomach lining. This irritation can result from increased acid production (gastroesophageal reflux disease), infection, food poisoning, taking certain medicines (such as nonsteroidal anti-inflammatory drugs), alcohol use, or tobacco use.  Signals from the brain.These signals could be caused by a headache, heat exposure, an inner ear disturbance, increased  pressure in the brain from injury, infection, a tumor, or a concussion, pain, emotional stimulus, or metabolic problems.  An obstruction in the gastrointestinal tract (bowel obstruction).  Illnesses such as diabetes, hepatitis, gallbladder problems, appendicitis, kidney problems, cancer, sepsis, atypical symptoms of a heart attack, or eating disorders.  Medical treatments such as chemotherapy and radiation.  Receiving medicine that makes you sleep (general anesthetic) during surgery. DIAGNOSIS Your caregiver may ask for tests to be done if the problems do not improve after a few days. Tests may also be done if symptoms are severe or if the reason for the nausea and vomiting is not clear. Tests may include:  Urine tests.  Blood tests.  Stool tests.  Cultures (to look for evidence of infection).  X-rays or other imaging studies. Test results can help your caregiver make decisions about treatment or the need for additional tests. TREATMENT You need to stay well hydrated. Drink frequently but in small amounts.You may wish to drink water, sports drinks, clear broth, or eat frozen ice pops or gelatin dessert to help stay hydrated.When you eat, eating slowly may help prevent nausea.There are also some antinausea medicines that may help prevent nausea. HOME CARE INSTRUCTIONS   Take all medicine as directed by your caregiver.  If you do not have an appetite, do not force yourself to eat. However, you must continue to drink fluids.  If you have an appetite, eat a normal diet unless your caregiver tells you differently.  Eat a variety of complex carbohydrates (rice, wheat, potatoes, bread), lean meats, yogurt, fruits, and vegetables.  Avoid high-fat foods because they are more difficult to digest.  Drink enough water and fluids to keep your urine clear or pale yellow.  If you are dehydrated, ask your caregiver for specific rehydration instructions. Signs of dehydration may  include:  Severe thirst.  Dry lips and mouth.  Dizziness.  Dark urine.  Decreasing urine frequency and amount.  Confusion.  Rapid breathing or pulse. SEEK IMMEDIATE MEDICAL CARE IF:   You have blood or brown flecks (like coffee grounds) in your vomit.  You have black or bloody stools.  You have a severe headache or stiff neck.  You are confused.  You have severe abdominal pain.  You have chest pain or trouble breathing.  You do not urinate at least once every 8 hours.  You develop cold or clammy skin.  You continue to vomit for longer than 24 to 48 hours.  You have a fever. MAKE SURE YOU:   Understand these instructions.  Will watch your condition.  Will get help right away if you are not doing well or get worse. Document Released: 09/19/2005 Document Revised: 12/12/2011 Document Reviewed: 02/16/2011 [facility] Patient Information 2015 [place], [place]. This information is not intended to replace advice given to you by your health care provider. Make sure you discuss any questions you have with your health care provider.

## 2014-11-12 NOTE — ED Provider Notes (Signed)
CSN: [id number]     Arrival date & time 11/12/14  1144 History   First MD Initiated Contact with Patient 11/12/14 1231     Chief Complaint  Patient presents with  . Nausea   (Consider location/radiation/quality/duration/timing/severity/associated sxs/prior Treatment) HPI      24 year old female presents complaining of nausea, vomiting, diarrhea. This initially started on Friday, 5 days ago. She had vomiting and diarrhea over the weekend. For the past 2 days she has only had nausea with no more vomiting or diarrhea. She denies any abdominal pain, fever. She has multiple sick contacts with a similar illness. She is eating and drinking without vomiting for the past 2 days. Denies dizziness. She is here requesting a note to return to work. She says that the nausea is not bad enough to require any medications and declines Zofran   History reviewed. No pertinent past medical history. History reviewed. No pertinent past surgical history. Family History  Problem Relation Age of Onset  . Hyperlipidemia Father   . Diabetes Other   . Hyperlipidemia Other   . Heart failure Other    History  Substance Use Topics  . Smoking status: Never Smoker   . Smokeless tobacco: Not on file  . Alcohol Use: Yes   OB History    No data available     Review of Systems  Gastrointestinal: Positive for nausea, vomiting and diarrhea. Negative for abdominal pain and blood in stool.  All other systems reviewed and are negative.   Allergies  Review of patient's allergies indicates no known allergies.  Home Medications   Prior to Admission medications   Medication Sig Start Date End Date Taking? Authorizing Provider  acetaminophen (TYLENOL) 500 MG tablet Take 500 mg by mouth every 6 (six) hours as needed for moderate pain.    Historical Provider, MD   BP 126/84 mmHg  Pulse 82  Temp(Src) 97.3 F (36.3 C) (Oral)  Resp 16  SpO2 100% Physical Exam  Constitutional: She is oriented to person, place, and time.  Vital signs are normal. She appears well-developed and well-nourished. No distress.  HENT:  Head: Normocephalic and atraumatic.  Pulmonary/Chest: Effort normal. No respiratory distress.  Abdominal: Soft. Bowel sounds are normal. She exhibits no distension and no mass. There is no tenderness. There is no rebound, no guarding and no CVA tenderness.  Neurological: She is alert and oriented to person, place, and time. She has normal strength. Coordination normal.  Skin: Skin is warm and dry. No rash noted. She is not diaphoretic.  Psychiatric: She has a normal mood and affect. Judgment normal.  Nursing note and vitals reviewed.  No orthostatic dizziness   ED Course  Procedures (including critical care time) Labs Review Labs Reviewed  POCT URINALYSIS DIP (DEVICE) - Abnormal; Notable for the following:    Hgb urine dipstick SMALL (*)    Protein, ur 30 (*)    Leukocytes, UA LARGE (*)    All other components within normal limits  POCT PREGNANCY, URINE    Imaging Review No results found.   MDM   1. Viral gastroenteritis   2. Nausea    She declines medications.  She may return to work.  F/U PRN if worsening     Graylon GoodZachary H Suyash Amory, PA-C 11/12/14 1300

## 2014-12-08 ENCOUNTER — Ambulatory Visit (INDEPENDENT_AMBULATORY_CARE_PROVIDER_SITE_OTHER): Payer: Self-pay | Admitting: Physician Assistant

## 2014-12-08 VITALS — BP 126/84 | HR 84 | Temp 97.4°F | Resp 16 | Ht 64.0 in | Wt 148.8 lb

## 2014-12-08 DIAGNOSIS — Z3200 Encounter for pregnancy test, result unknown: Secondary | ICD-10-CM

## 2014-12-08 DIAGNOSIS — Z3490 Encounter for supervision of normal pregnancy, unspecified, unspecified trimester: Secondary | ICD-10-CM | POA: Insufficient documentation

## 2014-12-08 LAB — POCT URINE PREGNANCY: Preg Test, Ur: POSITIVE

## 2014-12-08 NOTE — Patient Instructions (Addendum)
5 weeks 6 days if 10/28/14 was your last period. You will get a phone call to make appt with OB.  First Trimester of Pregnancy The first trimester of pregnancy is from week 1 until the end of week 12 (months 1 through 3). A week after a sperm fertilizes an egg, the egg will implant on the wall of the uterus. This embryo will begin to develop into a baby. Genes from you and your partner are forming the baby. The female genes determine whether the baby is a boy or a girl. At 6-8 weeks, the eyes and face are formed, and the heartbeat can be seen on ultrasound. At the end of 12 weeks, all the baby's organs are formed.  Now that you are pregnant, you will want to do everything you can to have a healthy baby. Two of the most important things are to get good prenatal care and to follow your health care provider's instructions. Prenatal care is all the medical care you receive before the baby's birth. This care will help prevent, find, and treat any problems during the pregnancy and childbirth. BODY CHANGES Your body goes through many changes during pregnancy. The changes vary from woman to woman.   You may gain or lose a couple of pounds at first.  You may feel sick to your stomach (nauseous) and throw up (vomit). If the vomiting is uncontrollable, call your health care provider.  You may tire easily.  You may develop headaches that can be relieved by medicines approved by your health care provider.  You may urinate more often. Painful urination may mean you have a bladder infection.  You may develop heartburn as a result of your pregnancy.  You may develop constipation because certain hormones are causing the muscles that push waste through your intestines to slow down.  You may develop hemorrhoids or swollen, bulging veins (varicose veins).  Your breasts may begin to grow larger and become tender. Your nipples may stick out more, and the tissue that surrounds them (areola) may become darker.  Your  gums may bleed and may be sensitive to brushing and flossing.  Dark spots or blotches (chloasma, mask of pregnancy) may develop on your face. This will likely fade after the baby is born.  Your menstrual periods will stop.  You may have a loss of appetite.  You may develop cravings for certain kinds of food.  You may have changes in your emotions from day to day, such as being excited to be pregnant or being concerned that something may go wrong with the pregnancy and baby.  You may have more vivid and strange dreams.  You may have changes in your hair. These can include thickening of your hair, rapid growth, and changes in texture. Some women also have hair loss during or after pregnancy, or hair that feels dry or thin. Your hair will most likely return to normal after your baby is born. WHAT TO EXPECT AT YOUR PRENATAL VISITS During a routine prenatal visit:  You will be weighed to make sure you and the baby are growing normally.  Your blood pressure will be taken.  Your abdomen will be measured to track your baby's growth.  The fetal heartbeat will be listened to starting around week 10 or 12 of your pregnancy.  Test results from any previous visits will be discussed. Your health care provider may ask you:  How you are feeling.  If you are feeling the baby move.  If you have  had any abnormal symptoms, such as leaking fluid, bleeding, severe headaches, or abdominal cramping.  If you have any questions. Other tests that may be performed during your first trimester include:  Blood tests to find your blood type and to check for the presence of any previous infections. They will also be used to check for low iron levels (anemia) and Rh antibodies. Later in the pregnancy, blood tests for diabetes will be done along with other tests if problems develop.  Urine tests to check for infections, diabetes, or protein in the urine.  An ultrasound to confirm the proper growth and  development of the baby.  An amniocentesis to check for possible genetic problems.  Fetal screens for spina bifida and Down syndrome.  You may need other tests to make sure you and the baby are doing well. HOME CARE INSTRUCTIONS  Medicines  Follow your health care provider's instructions regarding medicine use. Specific medicines may be either safe or unsafe to take during pregnancy.  Take your prenatal vitamins as directed.  If you develop constipation, try taking a stool softener if your health care provider approves. Diet  Eat regular, well-balanced meals. Choose a variety of foods, such as meat or vegetable-based protein, fish, milk and low-fat dairy products, vegetables, fruits, and whole grain breads and cereals. Your health care provider will help you determine the amount of weight gain that is right for you.  Avoid raw meat and uncooked cheese. These carry germs that can cause birth defects in the baby.  Eating four or five small meals rather than three large meals a day may help relieve nausea and vomiting. If you start to feel nauseous, eating a few soda crackers can be helpful. Drinking liquids between meals instead of during meals also seems to help nausea and vomiting.  If you develop constipation, eat more high-fiber foods, such as fresh vegetables or fruit and whole grains. Drink enough fluids to keep your urine clear or pale yellow. Activity and Exercise  Exercise only as directed by your health care provider. Exercising will help you:  Control your weight.  Stay in shape.  Be prepared for labor and delivery.  Experiencing pain or cramping in the lower abdomen or low back is a good sign that you should stop exercising. Check with your health care provider before continuing normal exercises.  Try to avoid standing for long periods of time. Move your legs often if you must stand in one place for a long time.  Avoid heavy lifting.  Wear low-heeled shoes, and  practice good posture.  You may continue to have sex unless your health care provider directs you otherwise. Relief of Pain or Discomfort  Wear a good support bra for breast tenderness.   Take warm sitz baths to soothe any pain or discomfort caused by hemorrhoids. Use hemorrhoid cream if your health care provider approves.   Rest with your legs elevated if you have leg cramps or low back pain.  If you develop varicose veins in your legs, wear support hose. Elevate your feet for 15 minutes, 3-4 times a day. Limit salt in your diet. Prenatal Care  Schedule your prenatal visits by the twelfth week of pregnancy. They are usually scheduled monthly at first, then more often in the last 2 months before delivery.  Write down your questions. Take them to your prenatal visits.  Keep all your prenatal visits as directed by your health care provider. Safety  Wear your seat belt at all times when driving.  Make a list of emergency phone numbers, including numbers for family, friends, the hospital, and police and fire departments. General Tips  Ask your health care provider for a referral to a local prenatal education class. Begin classes no later than at the beginning of month 6 of your pregnancy.  Ask for help if you have counseling or nutritional needs during pregnancy. Your health care provider can offer advice or refer you to specialists for help with various needs.  Do not use hot tubs, steam rooms, or saunas.  Do not douche or use tampons or scented sanitary pads.  Do not cross your legs for long periods of time.  Avoid cat litter boxes and soil used by cats. These carry germs that can cause birth defects in the baby and possibly loss of the fetus by miscarriage or stillbirth.  Avoid all smoking, herbs, alcohol, and medicines not prescribed by your health care provider. Chemicals in these affect the formation and growth of the baby.  Schedule a dentist appointment. At home, brush  your teeth with a soft toothbrush and be gentle when you floss. SEEK MEDICAL CARE IF:   You have dizziness.  You have mild pelvic cramps, pelvic pressure, or nagging pain in the abdominal area.  You have persistent nausea, vomiting, or diarrhea.  You have a bad smelling vaginal discharge.  You have pain with urination.  You notice increased swelling in your face, hands, legs, or ankles. SEEK IMMEDIATE MEDICAL CARE IF:   You have a fever.  You are leaking fluid from your vagina.  You have spotting or bleeding from your vagina.  You have severe abdominal cramping or pain.  You have rapid weight gain or loss.  You vomit blood or material that looks like coffee grounds.  You are exposed to [place] measles and have never had them.  You are exposed to fifth disease or chickenpox.  You develop a severe headache.  You have shortness of breath.  You have any kind of trauma, such as from a fall or a car accident. Document Released: 09/13/2001 Document Revised: 02/03/2014 Document Reviewed: 07/30/2013 [facility] Patient Information 2015 [place], [place]. This information is not intended to replace advice given to you by your health care provider. Make sure you discuss any questions you have with your health care provider.

## 2014-12-08 NOTE — Progress Notes (Signed)
Subjective:    Patient ID: Alicia Singleton, female    DOB: 1991/09/27, 24 y.o.   MRN: [id number]  HPI  This is a 24 year old female who is presenting wanting a pregnancy test. States she had implanon taken out last June 2015 and has been on an OCP since that time. States she has not had a period in over 4 years, since before becoming pregnant with her first child. Since getting the implanon taken out she has had occasional spotting but no periods. Last had spotting for 1 day on 10/28/14. States she takes her OCPs regularly - states every once in a while she will miss taking it at night and take in the next morning. She has had two pregnancies - first resulted in miscarriage, 2nd results in live birth. She has taken 3 home pregnancy tests, all positive. She took tests d/t past 2 days of nausea, vomiting and breast tenderness. She is wondering how to tell how far along she is since she doesn't have normal periods. She does not currently have insurance. She is in the process of applying for medicaid. She does not smoke, drink or use recreational drugs.  Review of Systems  Constitutional: Negative for fever and chills.  Gastrointestinal: Positive for nausea and vomiting. Negative for abdominal pain.  Genitourinary: Positive for menstrual problem. Negative for dysuria, vaginal bleeding, vaginal discharge, vaginal pain and pelvic pain.       Breast tenderness  Skin: Negative for rash.  Hematological: Negative for adenopathy.    There are no active problems to display for this patient.  Prior to Admission medications   Medication Sig Start Date End Date Taking? Authorizing Provider  acetaminophen (TYLENOL) 500 MG tablet Take 500 mg by mouth every 6 (six) hours as needed for moderate pain.   Yes Historical Provider, MD   No Known Allergies  Patient's social and family history were reviewed.     Objective:   Physical Exam  Constitutional: She is oriented to person, place, and time. She appears  well-developed and well-nourished. No distress.  HENT:  Head: Normocephalic and atraumatic.  Right Ear: Hearing normal.  Left Ear: Hearing normal.  Nose: Nose normal.  Eyes: Conjunctivae and lids are normal. Right eye exhibits no discharge. Left eye exhibits no discharge. No scleral icterus.  Cardiovascular: Normal rate, regular rhythm, normal heart sounds, intact distal pulses and normal pulses.   No murmur heard. Pulmonary/Chest: Effort normal and breath sounds normal. No respiratory distress. She has no wheezes. She has no rhonchi. She has no rales.  Abdominal: Soft. Normal appearance. There is no tenderness.  Musculoskeletal: Normal range of motion.  Neurological: She is alert and oriented to person, place, and time.  Skin: Skin is warm, dry and intact. No lesion and no rash noted.  Psychiatric: She has a normal mood and affect. Her speech is normal and behavior is normal. Thought content normal.   BP 126/84 mmHg  Pulse 84  Temp(Src) 97.4 F (36.3 C) (Oral)  Resp 16  Ht  (1.626 m)  Wt 148 lb 12.8 oz (67.495 kg)  BMI 25.53 kg/m2  SpO2 100%  LMP 10/28/2014  Results for orders placed or performed in visit on 12/08/14  POCT urine pregnancy  Result Value Ref Range   Preg Test, Ur Positive       Assessment & Plan:  1. Possible pregnancy 2. Pregnancy Urine pregnancy positive. Estimate gestation 5 weeks 6 days if LMP was 10/28/14 when she had 1 day  of spotting. Referral made to [facility]. She is applying for medicaid. Information give on 1st trimester of pregnancy - foods/medicine to avoid. - POCT urine pregnancy - Ambulatory referral to Obstetrics / Gynecology   Roswell MinersNicole V. Dyke BrackettBush, PA-C, MHS [facility]  12/08/2014

## 2014-12-16 LAB — OB RESULTS CONSOLE HGB/HCT, BLOOD: Hemoglobin: 13 g/dL

## 2014-12-16 LAB — OB RESULTS CONSOLE ANTIBODY SCREEN: Antibody Screen: NEGATIVE

## 2014-12-16 LAB — OB RESULTS CONSOLE HEPATITIS B SURFACE ANTIGEN: HEP B S AG: NEGATIVE

## 2014-12-16 LAB — OB RESULTS CONSOLE HIV ANTIBODY (ROUTINE TESTING): HIV: NONREACTIVE

## 2014-12-16 LAB — OB RESULTS CONSOLE PLATELET COUNT: PLATELETS: 354 10*3/uL

## 2014-12-16 LAB — OB RESULTS CONSOLE ABO/RH: RH TYPE: POSITIVE

## 2014-12-16 LAB — OB RESULTS CONSOLE RUBELLA ANTIBODY, IGM: RUBELLA: IMMUNE

## 2014-12-16 LAB — OB RESULTS CONSOLE RPR: RPR: NONREACTIVE

## 2015-02-28 ENCOUNTER — Encounter (HOSPITAL_COMMUNITY): Payer: Self-pay | Admitting: Emergency Medicine

## 2015-02-28 ENCOUNTER — Emergency Department (INDEPENDENT_AMBULATORY_CARE_PROVIDER_SITE_OTHER)
Admission: EM | Admit: 2015-02-28 | Discharge: 2015-02-28 | Disposition: A | Discharge status: Home / Self Care | Payer: Medicaid Other | Source: Home / Self Care | Attending: Family Medicine | Admitting: Family Medicine

## 2015-02-28 DIAGNOSIS — H1013 Acute atopic conjunctivitis, bilateral: Secondary | ICD-10-CM

## 2015-02-28 DIAGNOSIS — L200 Besnier's prurigo: Secondary | ICD-10-CM

## 2015-02-28 DIAGNOSIS — L239 Allergic contact dermatitis, unspecified cause: Secondary | ICD-10-CM

## 2015-02-28 MED ORDER — KETOTIFEN FUMARATE 0.025 % OP SOLN: 1.0000 [drp] | Freq: Two times a day (BID) | OPHTHALMIC | Status: DC

## 2015-02-28 MED ORDER — TRIAMCINOLONE ACETONIDE 0.1 % EX CREA: 1.0000 "application " | TOPICAL_CREAM | Freq: Two times a day (BID) | CUTANEOUS | Status: DC

## 2015-02-28 MED ORDER — TOBRAMYCIN 0.3 % OP SOLN: 1.0000 [drp] | OPHTHALMIC | Status: DC

## 2015-02-28 NOTE — ED Provider Notes (Signed)
CSN: [id number]     Arrival date & time 02/28/15  1125 History   First MD Initiated Contact with Patient 02/28/15 1348     Chief Complaint  Patient presents with  . Conjunctivitis   (Consider location/radiation/quality/duration/timing/severity/associated sxs/prior Treatment) HPI Comments: 24 year old female who states she is [redacted] weeks pregnant developed some drainage and redness of the right eye yesterday morning. Hours later she developed similar symptoms to the left eye. 20 end of the day she called her OB/GYN and a prescription for Polytrim ophthalmic drops were prescribed for her. She started taking them and felt that she was getting a little worse. She is complaining of a clear to slightly amber colored drainage that is blood-tinged. Denies other eye pain. She has mild blurred vision in the right eye within the mucus accumulates.   History reviewed. No pertinent past medical history. History reviewed. No pertinent past surgical history. Family History  Problem Relation Age of Onset  . Hyperlipidemia Father   . Diabetes Other   . Hyperlipidemia Other   . Heart failure Other    History  Substance Use Topics  . Smoking status: Never Smoker   . Smokeless tobacco: Not on file  . Alcohol Use: Yes   OB History    Gravida Para Term Preterm AB TAB SAB Ectopic Multiple Living   2         1     Review of Systems  Constitutional: Negative.   HENT: Negative.  Negative for ear pain and sore throat.   Eyes: Positive for discharge, redness, itching and visual disturbance.  Respiratory: Negative.   Gastrointestinal: Negative for nausea, vomiting and abdominal pain.  Genitourinary: Negative.   Musculoskeletal: Negative.   Skin: Positive for rash.       Fine papular rash to the 4 head and lesser to the face. It is mildly pruritic. It developed yesterday.  Neurological: Negative for dizziness, tremors, syncope and headaches.  Hematological: Negative for adenopathy.  Psychiatric/Behavioral:  Negative.     Allergies  Review of patient's allergies indicates no known allergies.  Home Medications   Prior to Admission medications   Medication Sig Start Date End Date Taking? Authorizing Provider  acetaminophen (TYLENOL) 500 MG tablet Take 500 mg by mouth every 6 (six) hours as needed for moderate pain.    Historical Provider, MD  ketotifen (ZADITOR) 0.025 % ophthalmic solution Place 1 drop into both eyes 2 (two) times daily. 02/28/15   Hayden Rasmussen, NP  tobramycin (TOBREX) 0.3 % ophthalmic solution Place 1 drop into both eyes every 4 (four) hours. 02/28/15   Hayden Rasmussen, NP  triamcinolone cream (KENALOG) 0.1 % Apply 1 application topically 2 (two) times daily. 02/28/15   Hayden Rasmussen, NP   BP 116/82 mmHg  Pulse 87  Temp(Src) 98.5 F (36.9 C) (Oral)  Resp 16  SpO2 100%  LMP 10/28/2014 Physical Exam  Constitutional: She is oriented to person, place, and time. She appears well-developed and well-nourished. No distress.  HENT:  Mouth/Throat: Oropharynx is clear and moist. No oropharyngeal exudate.  Eyes: EOM are normal. Pupils are equal, round, and reactive to light. Right eye exhibits discharge. Left eye exhibits discharge.   inject scleraed. There is also conjunctival edema with mild spotty hemorrhaging, greatest on the right. lid conjunctiva erythematous with rough textured appearance. Pupils are round and reactive. No limbal flush. Anterior chamber is clear. Normal red reflex.   Neck: Normal range of motion. Neck supple.  Cardiovascular: Normal rate.   Pulmonary/Chest: Effort normal.  No respiratory distress.  Neurological: She is alert and oriented to person, place, and time.  Skin: Skin is warm and dry.  Fine papular rash noted to the 4 head and lesser to the face.  Psychiatric: She has a normal mood and affect.  Nursing note and vitals reviewed.   ED Course  Procedures (including critical care time) Labs Review Labs Reviewed - No data to display  Imaging Review No  results found.   MDM   1. Allergic conjunctivitis, bilateral   2. Allergic dermatitis     Stop the Polytrim Eye drops and start the Tobramycin and Zaditor drops USe diluted triamcinolone cream to areas of face with rash bid. F/U with PCP   Hayden Rasmussenavid Gradyn Shein, NP 02/28/15 1415

## 2015-02-28 NOTE — ED Notes (Signed)
Pt states that she woke up yesterday morning to her right eye closed shut by drainage from her she got eye drops from her OB/GYN because she states that she is [redacted] weeks pregnant and that the drainage went to her left eye

## 2015-02-28 NOTE — Discharge Instructions (Signed)
Allergic Conjunctivitis Stop the Polytrim Eye drops and start the Tobramycin and Zaditor drops   The conjunctiva is a thin membrane that covers the visible white part of the eyeball and the underside of the eyelids. This membrane protects and lubricates the eye. The membrane has small blood vessels running through it that can normally be seen. When the conjunctiva becomes inflamed, the condition is called conjunctivitis. In response to the inflammation, the conjunctival blood vessels become swollen. The swelling results in redness in the normally white part of the eye. The blood vessels of this membrane also react when a person has allergies and is then called allergic conjunctivitis. This condition usually lasts for as long as the allergy persists. Allergic conjunctivitis cannot be passed to another person (non-contagious). The likelihood of bacterial infection is great and the cause is not likely due to allergies if the inflamed eye has:  A sticky discharge.  Discharge or sticking together of the lids in the morning.  Scaling or flaking of the eyelids where the eyelashes come out.  Red swollen eyelids. CAUSES   Viruses.  Irritants such as foreign bodies.  Chemicals.  General allergic reactions.  Inflammation or serious diseases in the inside or the outside of the eye or the orbit (the boney cavity in which the eye sits) can cause a "red eye." SYMPTOMS   Eye redness.  Tearing.  Itchy eyes.  Burning feeling in the eyes.  Clear drainage from the eye.  Allergic reaction due to pollens or ragweed sensitivity. Seasonal allergic conjunctivitis is frequent in the spring when pollens are in the air and in the fall. DIAGNOSIS  This condition, in its many forms, is usually diagnosed based on the history and an ophthalmological exam. It usually involves both eyes. If your eyes react at the same time every year, allergies may be the cause. While most "red eyes" are due to allergy or an  infection, the role of an eye (ophthalmological) exam is important. The exam can rule out serious diseases of the eye or orbit. TREATMENT   Non-antibiotic eye drops, ointments, or medications by mouth may be prescribed if the ophthalmologist is sure the conjunctivitis is due to allergies alone.  Over-the-counter drops and ointments for allergic symptoms should be used only after other causes of conjunctivitis have been ruled out, or as your caregiver suggests. Medications by mouth are often prescribed if other allergy-related symptoms are present. If the ophthalmologist is sure that the conjunctivitis is due to allergies alone, treatment is normally limited to drops or ointments to reduce itching and burning. HOME CARE INSTRUCTIONS   Wash hands before and after applying drops or ointments, or touching the inflamed eye(s) or eyelids.  Do not let the eye dropper tip or ointment tube touch the eyelid when putting medicine in your eye.  Stop using your soft contact lenses and throw them away. Use a new pair of lenses when recovery is complete. You should run through sterilizing cycles at least three times before use after complete recovery if the old soft contact lenses are to be used. Hard contact lenses should be stopped. They need to be thoroughly sterilized before use after recovery.  Itching and burning eyes due to allergies is often relieved by using a cool cloth applied to closed eye(s). SEEK MEDICAL CARE IF:   Your problems do not go away after two or three days of treatment.  Your lids are sticky (especially in the morning when you wake up) or stick together.  Discharge  develops. Antibiotics may be needed either as drops, ointment, or by mouth.  You have extreme light sensitivity.  An oral temperature above 102 F (38.9 C) develops.  Pain in or around the eye or any other visual symptom develops. MAKE SURE YOU:   Understand these instructions.  Will watch your  condition.  Will get help right away if you are not doing well or get worse. Document Released: 12/10/2002 Document Revised: 12/12/2011 Document Reviewed: 11/05/2007 [facility] Patient Information 2015 [place], [place]. This information is not intended to replace advice given to you by your health care provider. Make sure you discuss any questions you have with your health care provider.  Eye Drops Use eye drops as directed. It may be easier to have someone help you put the drops in your eye. If you are alone, use the following instructions to help you.  Wash your hands before putting drops in your eyes.  Read the label and look at your medication. Check for any expiration date that may appear on the bottle or tube. Changes of color may be a warning that the medication is old or ineffective. This is especially true if the medication has become brown in color. If you have questions or concerns, call your caregiver. DROPS  Tilt your head back with the affected eye uppermost. Gently pull down on your lower lid. Do not pull up on the upper lid.  Look up. Place the dropper or bottle just over the edge of the lower lid near the white portion at the bottom of the eye. The goal is to have the drop go into the little sac formed by the lower lid and the bottom of the eye itself. Do not release the drop from a height of several inches over the eye. That will only serve to startle the person receiving the medicine when it lands and forces a blink.  Steady your hand in a comfortable manner. An example would be to hold the dropper or bottle between your thumb and index (pointing) finger. Lean your index finger against the brow.  Then, slowly and gently squeeze one drop of medication into your eye.  Once the medication has been applied, place your finger between the lower eyelid and the nose, pressing firmly against the nose for 5-10 seconds. This will slow the process of the eye drop entering the small canal  that normally drains tears into the nose, and therefore increases the exposure of the medicine to the eye for a few extra seconds. OINTMENTS  Look up. Place the tip of the tube just over the edge of the lower lid near the white portion at the bottom of the eye. The goal is to create a line of ointment along the inner surface of the eyelid in the little sac formed by the lower lid and the bottom of the eye itself.  Avoid touching the tube tip to your eyeball or eyelid. This avoids contamination of the tube or the medicine in the tube.  Once a line of medicine has been created, hold the upper lid up and look down before releasing the upper lid. This will force the ointment to spread over the surface of the eye.  Your vision will be very blurry for a few minutes after applying an ointment properly. This is normal and will clear as you continue to blink. For this reason, it is best to apply ointments just before going to sleep, or at a time when you can rest your eyes for  5-10 minutes after applying the medication. GENERAL  Store your medicine in a cool, dry place after each use.  If you need a second medication, wait at least two minutes. This helps the first medication to be taken up (absorbed) by the eye.  If you have been instructed to use both an eye drop and an eye ointment, always apply the drop first and then the ointment 3-4 minutes afterward. Never put medications into the eye unless the label reads, "For Ophthalmic Use," "For Use In Eyes" or "Eye Drops." If you have questions, call your caregiver. Document Released: 12/26/2000 Document Revised: 02/03/2014 Document Reviewed: 03/03/2009 [facility] [employer] Patient Information 2015 [place]. This information is not intended to replace advice given to you by your health care provider. Make sure you discuss any questions you have with your health care provider.

## 2015-05-07 LAB — OB RESULTS CONSOLE HGB/HCT, BLOOD
HCT: 34 %
Hemoglobin: 11.7 g/dL

## 2015-05-07 LAB — OB RESULTS CONSOLE RPR: RPR: NONREACTIVE

## 2015-06-18 ENCOUNTER — Encounter (HOSPITAL_COMMUNITY): Payer: Self-pay | Admitting: *Deleted

## 2015-06-18 ENCOUNTER — Inpatient Hospital Stay (HOSPITAL_COMMUNITY)
Admission: AD | Admit: 2015-06-18 | Discharge: 2015-06-18 | Disposition: A | Discharge status: Home / Self Care | Payer: Medicaid Other | Source: Ambulatory Visit | Attending: Obstetrics & Gynecology | Admitting: Obstetrics & Gynecology

## 2015-06-18 ENCOUNTER — Inpatient Hospital Stay (HOSPITAL_COMMUNITY): Payer: Medicaid Other

## 2015-06-18 DIAGNOSIS — Z3A33 33 weeks gestation of pregnancy: Secondary | ICD-10-CM | POA: Insufficient documentation

## 2015-06-18 DIAGNOSIS — O99820 Streptococcus B carrier state complicating pregnancy: Secondary | ICD-10-CM | POA: Diagnosis not present

## 2015-06-18 DIAGNOSIS — O26893 Other specified pregnancy related conditions, third trimester: Secondary | ICD-10-CM | POA: Diagnosis not present

## 2015-06-18 DIAGNOSIS — Z0371 Encounter for suspected problem with amniotic cavity and membrane ruled out: Secondary | ICD-10-CM

## 2015-06-18 LAB — AMNISURE RUPTURE OF MEMBRANE (ROM) NOT AT ARMC: Amnisure ROM: NEGATIVE

## 2015-06-18 NOTE — MAU Note (Signed)
Pt reprots she has increase in discharge. Office sent her in to check if her water broke. Good fetal movement reported and no ctx reported.

## 2015-06-18 NOTE — MAU Note (Signed)
Urine in lab 

## 2015-06-18 NOTE — MAU Provider Note (Signed)
Alicia Singleton is a 24 y.o. G2P0 at 33.4 weeks presented from the office with c/o leaking x 1 week.  SSE in the office appreciated fluid and mucus in the vault.  Positive Nitrazine but unable to capture a spec min for fern.  Pt here to RU ROM.  She is GBS+ and and CSx1.  She stopped her 17P last week.      History     Patient Active Problem List   Diagnosis Date Noted  . Pregnancy 12/08/2014    Chief Complaint  Patient presents with  . Rupture of Membranes   HPI  OB History    Gravida Para Term Preterm AB TAB SAB Ectopic Multiple Living   2         1      Past Medical History  Diagnosis Date  . Medical history non-contributory     Past Surgical History  Procedure Laterality Date  . No past surgeries      Family History  Problem Relation Age of Onset  . Hyperlipidemia Father   . Diabetes Other   . Hyperlipidemia Other   . Heart failure Other     Social History  Substance Use Topics  . Smoking status: Never Smoker   . Smokeless tobacco: None  . Alcohol Use: Yes    Allergies: No Known Allergies  Prescriptions prior to admission  Medication Sig Dispense Refill Last Dose  . acetaminophen (TYLENOL) 500 MG tablet Take 500 mg by mouth every 6 (six) hours as needed for moderate pain.   Taking  . ketotifen (ZADITOR) 0.025 % ophthalmic solution Place 1 drop into both eyes 2 (two) times daily. 5 mL 0   . tobramycin (TOBREX) 0.3 % ophthalmic solution Place 1 drop into both eyes every 4 (four) hours. 5 mL 0   . triamcinolone cream (KENALOG) 0.1 % Apply 1 application topically 2 (two) times daily. 15 g 0     ROS See HPI above, all other systems are negative  Physical Exam   Blood pressure 136/80, pulse 91, temperature 98.3 F (36.8 C), resp. rate 18, height  (1.626 m), weight 162 lb 9.6 oz (73.755 kg), last menstrual period 10/28/2014.  Physical Exam Ext:  WNL ABD: Soft, non tender to palpation, no rebound or guarding SVE:   ED Course  Assessment: IUP  at  33.4 weeks Membranes: questionable PPROM FHR: Category 1 with appropriate sleep cycle for GA CTX:  none   Plan: Labs: [employer], CNM, MSN 06/18/2015. 3:56 PM   MAU Addendum Note  Results for orders placed or performed during the hospital encounter of 06/18/15 (from the past 24 hour(s))  Amnisure rupture of membrane (rom)not at [facility]     Status: None   Collection Time: 06/18/15  4:05 PM  Result Value Ref Range   Amnisure ROM NEGATIVE    NST: FHR 145, moderate variable, with prolong minimal variable, occasional accel, one decel  Plan: [place] AFI and BPP    Yulanda Diggs, CNM, MSN 06/18/2015. 4:24 PM  [place] report: FHR 143, AFI 14, BPP 8/8  MAU Addendum Note   Plan: -Discussed need to follow up in office i 1 week -Bleeding and PTL Precautions -Encouraged to call if any questions or concerns arise prior to next scheduled office visit.  -Discharged to home in stable condition    Jayme Cham, CNM, MSN 06/18/2015. 6:08 PM

## 2015-06-18 NOTE — Discharge Instructions (Signed)

## 2015-07-07 LAB — OB RESULTS CONSOLE GC/CHLAMYDIA
CHLAMYDIA, DNA PROBE: NEGATIVE
GC PROBE AMP, GENITAL: NEGATIVE

## 2015-07-22 ENCOUNTER — Inpatient Hospital Stay (HOSPITAL_COMMUNITY)
Admission: AD | Admit: 2015-07-22 | Discharge: 2015-07-23 | Disposition: A | Discharge status: Home / Self Care | Payer: Medicaid Other | Source: Ambulatory Visit | Attending: Obstetrics and Gynecology | Admitting: Obstetrics and Gynecology

## 2015-07-22 ENCOUNTER — Encounter (HOSPITAL_COMMUNITY): Payer: Self-pay

## 2015-07-22 DIAGNOSIS — O26893 Other specified pregnancy related conditions, third trimester: Secondary | ICD-10-CM | POA: Diagnosis not present

## 2015-07-22 DIAGNOSIS — R102 Pelvic and perineal pain: Secondary | ICD-10-CM | POA: Insufficient documentation

## 2015-07-22 DIAGNOSIS — Z3A38 38 weeks gestation of pregnancy: Secondary | ICD-10-CM | POA: Insufficient documentation

## 2015-07-22 LAB — URINALYSIS, ROUTINE W REFLEX MICROSCOPIC
BILIRUBIN URINE: NEGATIVE
GLUCOSE, UA: NEGATIVE mg/dL
HGB URINE DIPSTICK: NEGATIVE
Ketones, ur: NEGATIVE mg/dL
Leukocytes, UA: NEGATIVE
Nitrite: NEGATIVE
Protein, ur: NEGATIVE mg/dL
SPECIFIC GRAVITY, URINE: 1.01 (ref 1.005–1.030)
UROBILINOGEN UA: 0.2 mg/dL (ref 0.0–1.0)
pH: 7 (ref 5.0–8.0)

## 2015-07-22 MED ORDER — ACETAMINOPHEN 500 MG PO TABS
1000.0000 mg | ORAL_TABLET | Freq: Once | ORAL | Status: AC
  Administered 2015-07-23: 1000 mg via ORAL
  Filled 2015-07-22: qty 2

## 2015-07-22 NOTE — MAU Provider Note (Signed)
History    Alicia Singleton is a 24 y.o. GP0111 at 38.2wks who presents, unannounced, for right abdominal pain.  Patient states pain started around 9pm and has been constant with moments of increased intensity.  Patient reports pain is "like a shooting pain" on the right side with a dull aching sensation on the left side.  Patient reports good fetal movement and denies LOF, VB, and issues with urination or bowel movements.  Patient states that she did take a warm shower and had some relief, but did not attempt any OTC medications.  Patient reports being 4cm dilated at last office visit on 10/18.   Patient Active Problem List   Diagnosis Date Noted  . Pregnancy 12/08/2014    Chief Complaint  Patient presents with  . Abdominal Pain   HPI  OB History    Gravida Para Term Preterm AB TAB SAB Ectopic Multiple Living   2         1      Past Medical History  Diagnosis Date  . Medical history non-contributory     Past Surgical History  Procedure Laterality Date  . No past surgeries      Family History  Problem Relation Age of Onset  . Hyperlipidemia Father   . Diabetes Father   . Diabetes Other   . Hyperlipidemia Other   . Heart failure Other     Social History  Substance Use Topics  . Smoking status: Never Smoker   . Smokeless tobacco: None  . Alcohol Use: Yes     Comment: social before preg    Allergies: No Known Allergies  Prescriptions prior to admission  Medication Sig Dispense Refill Last Dose  . acetaminophen (TYLENOL) 500 MG tablet Take 1,000 mg by mouth every 6 (six) hours as needed for headache.    Past Month at Unknown time  . cetirizine (ZYRTEC) 10 MG tablet Take 10 mg by mouth daily as needed for allergies.   07/21/2015 at Unknown time  . omeprazole (PRILOSEC) 20 MG capsule Take 20 mg by mouth daily as needed (acid reflux).   Past Month at Unknown time  . Prenatal Vit-Fe Fumarate-FA (PNV PRENATAL PLUS MULTIVITAMIN PO) Take 1 tablet by mouth daily.   07/22/2015  at Unknown time  . cyclobenzaprine (FLEXERIL) 5 MG tablet Take 5-10 mg by mouth every 8 (eight) hours as needed for muscle spasms.   More than a month at Unknown time    ROS  See HPI Above Physical Exam   Blood pressure 125/79, pulse 105, temperature 98 F (36.7 C), temperature source Oral, resp. rate 16, last menstrual period 10/28/2014, SpO2 100 %.  Results for orders placed or performed during the hospital encounter of 07/22/15 (from the past 24 hour(s))  Urinalysis, Routine w reflex microscopic [facility])     Status: Abnormal   Collection Time: 07/22/15 11:04 PM  Result Value Ref Range   Color, Urine YELLOW YELLOW   APPearance CLOUDY (A) CLEAR   Specific Gravity, Urine 1.010 1.005 - 1.030   pH 7.0 5.0 - 8.0   Glucose, UA NEGATIVE NEGATIVE mg/dL   Hgb urine dipstick NEGATIVE NEGATIVE   Bilirubin Urine NEGATIVE NEGATIVE   Ketones, ur NEGATIVE NEGATIVE mg/dL   Protein, ur NEGATIVE NEGATIVE mg/dL   Urobilinogen, UA 0.2 0.0 - 1.0 mg/dL   Nitrite NEGATIVE NEGATIVE   Leukocytes, UA NEGATIVE NEGATIVE    Physical Exam  Vitals reviewed. Constitutional: She is oriented to person, place, and time. She  appears well-developed and well-nourished. No distress.  HENT:  Head: Normocephalic and atraumatic.  Eyes: EOM are normal.  Neck: Normal range of motion.  Cardiovascular: Normal rate.   Respiratory: Effort normal.  GI: Soft. There is no tenderness. There is no guarding.  Gravid--fundal height appears AGA, Soft, NT  Genitourinary: Vagina normal.  Musculoskeletal: Normal range of motion. She exhibits no edema.  Neurological: She is alert and oriented to person, place, and time.  Skin: Skin is warm and dry.   SVE: 4/50/-3  FHR:145 bpm, Mod Var, -Decels, +Accels UC: Occasional  ED Course  Assessment: IUP at 38.2wks Cat I FT Irregular Contractions Round Ligament Pain Advanced Dilation  Plan: -Discussed round ligament pain and comfort measures -Discussed VE findings, labor  precautions; including contractions and SROM -Informed on who and when to call for pregnancy and labor related concerns -Patient accepts tylenol for pain mgmt, but declines flexeril or percocet -Tylenol  once PO now -Encouraged to call if any questions or concerns arise prior to next scheduled office visit.  -No questions or concerns -Keep appt as scheduled -Discharged to home in stable condition  Dariel Pellecchia LYNN CNM, MSN 07/22/2015 11:59 PM

## 2015-07-22 NOTE — MAU Note (Signed)
At 9 pm started having tightnings and cramping on right lower abd, comes and goes.  No bleeding. No leaking. Baby moving well. Was 4 cm on Tuesday in office.

## 2015-07-23 DIAGNOSIS — O26893 Other specified pregnancy related conditions, third trimester: Secondary | ICD-10-CM | POA: Diagnosis not present

## 2015-07-23 NOTE — Discharge Instructions (Signed)
Third Trimester of Pregnancy °The third trimester is from week 29 through week 42, months 7 through 9. This trimester is when your unborn baby (fetus) is growing very fast. At the end of the ninth month, the unborn baby is about 20 inches in length. It weighs about 6-10 pounds.  °HOME CARE  °· Avoid all smoking, herbs, and alcohol. Avoid drugs not approved by your doctor. °· Do not use any tobacco products, including cigarettes, chewing tobacco, and electronic cigarettes. If you need help quitting, ask your doctor. You may get counseling or other support to help you quit. °· Only take medicine as told by your doctor. Some medicines are safe and some are not during pregnancy. °· Exercise only as told by your doctor. Stop exercising if you start having cramps. °· Eat regular, healthy meals. °· Wear a good support bra if your breasts are tender. °· Do not use hot tubs, steam rooms, or saunas. °· Wear your seat belt when driving. °· Avoid raw meat, uncooked cheese, and liter boxes and soil used by cats. °· Take your prenatal vitamins. °· Take 1500-2000 milligrams of calcium daily starting at the 20th week of pregnancy until you deliver your baby. °· Try taking medicine that helps you poop (stool softener) as needed, and if your doctor approves. Eat more fiber by eating fresh fruit, vegetables, and whole grains. Drink enough fluids to keep your pee (urine) clear or pale yellow. °· Take warm water baths (sitz baths) to soothe pain or discomfort caused by hemorrhoids. Use hemorrhoid cream if your doctor approves. °· If you have puffy, bulging veins (varicose veins), wear support hose. Raise (elevate) your feet for 15 minutes, 3-4 times a day. Limit salt in your diet. °· Avoid heavy lifting, wear low heels, and sit up straight. °· Rest with your legs raised if you have leg cramps or low back pain. °· Visit your dentist if you have not gone during your pregnancy. Use a soft toothbrush to brush your teeth. Be gentle when you  floss. °· You can have sex (intercourse) unless your doctor tells you not to. °· Do not travel far distances unless you must. Only do so with your doctor's approval. °· Take prenatal classes. °· Practice driving to the hospital. °· Pack your hospital bag. °· Prepare the baby's room. °· Go to your doctor visits. °GET HELP IF: °· You are not sure if you are in labor or if your water has broken. °· You are dizzy. °· You have mild cramps or pressure in your lower belly (abdominal). °· You have a nagging pain in your belly area. °· You continue to feel sick to your stomach (nauseous), throw up (vomit), or have watery poop (diarrhea). °· You have bad smelling fluid coming from your vagina. °· You have pain with peeing (urination). °GET HELP RIGHT AWAY IF:  °· You have a fever. °· You are leaking fluid from your vagina. °· You are spotting or bleeding from your vagina. °· You have severe belly cramping or pain. °· You lose or gain weight rapidly. °· You have trouble catching your breath and have chest pain. °· You notice sudden or extreme puffiness (swelling) of your face, hands, ankles, feet, or legs. °· You have not felt the baby move in over an hour. °· You have severe headaches that do not go away with medicine. °· You have vision changes. °  °This information is not intended to replace advice given to you by your health care   provider. Make sure you discuss any questions you have with your health care provider. °  °Document Released: 12/14/2009 Document Revised: 10/10/2014 Document Reviewed: 11/20/2012 °Elsevier Interactive Patient Education ©2016 [employer] °Fetal Movement Counts °Patient Name: __________________________________________________ Patient Due Date: ____________________ °Performing a fetal movement count is highly recommended in high-risk pregnancies, but it is good for every pregnant woman to do. Your health care provider may ask you to start counting fetal movements at 28 weeks of the pregnancy.  Fetal movements often increase: °· After eating a full meal. °· After physical activity. °· After eating or drinking something sweet or cold. °· At rest. °Pay attention to when you feel the baby is most active. This will help you notice a pattern of your baby's sleep and wake cycles and what factors contribute to an increase in fetal movement. It is important to perform a fetal movement count at the same time each day when your baby is normally most active.  °HOW TO COUNT FETAL MOVEMENTS °· Find a quiet and comfortable area to sit or lie down on your left side. Lying on your left side provides the best blood and oxygen circulation to your baby. °· Write down the day and time on a sheet of paper or in a journal. °· Start counting kicks, flutters, swishes, rolls, or jabs in a 2-hour period. You should feel at least 10 movements within 2 hours. °· If you do not feel 10 movements in 2 hours, wait 2-3 hours and count again. Look for a change in the pattern or not enough counts in 2 hours. °SEEK MEDICAL CARE IF: °· You feel less than 10 counts in 2 hours, tried twice. °· There is no movement in over an hour. °· The pattern is changing or taking longer each day to reach 10 counts in 2 hours. °· You feel the baby is not moving as he or she usually does. °Date: ____________ Movements: ____________ Start time: ____________ Finish time: ____________  °Date: ____________ Movements: ____________ Start time: ____________ Finish time: ____________ °Date: ____________ Movements: ____________ Start time: ____________ Finish time: ____________ °Date: ____________ Movements: ____________ Start time: ____________ Finish time: ____________ °Date: ____________ Movements: ____________ Start time: ____________ Finish time: ____________ °Date: ____________ Movements: ____________ Start time: ____________ Finish time: ____________ °Date: ____________ Movements: ____________ Start time: ____________ Finish time: ____________ °Date:  ____________ Movements: ____________ Start time: ____________ Finish time: ____________  °Date: ____________ Movements: ____________ Start time: ____________ Finish time: ____________ °Date: ____________ Movements: ____________ Start time: ____________ Finish time: ____________ °Date: ____________ Movements: ____________ Start time: ____________ Finish time: ____________ °Date: ____________ Movements: ____________ Start time: ____________ Finish time: ____________ °Date: ____________ Movements: ____________ Start time: ____________ Finish time: ____________ °Date: ____________ Movements: ____________ Start time: ____________ Finish time: ____________ °Date: ____________ Movements: ____________ Start time: ____________ Finish time: ____________  °Date: ____________ Movements: ____________ Start time: ____________ Finish time: ____________ °Date: ____________ Movements: ____________ Start time: ____________ Finish time: ____________ °Date: ____________ Movements: ____________ Start time: ____________ Finish time: ____________ °Date: ____________ Movements: ____________ Start time: ____________ Finish time: ____________ °Date: ____________ Movements: ____________ Start time: ____________ Finish time: ____________ °Date: ____________ Movements: ____________ Start time: ____________ Finish time: ____________ °Date: ____________ Movements: ____________ Start time: ____________ Finish time: ____________  °Date: ____________ Movements: ____________ Start time: ____________ Finish time: ____________ °Date: ____________ Movements: ____________ Start time: ____________ Finish time: ____________ °Date: ____________ Movements: ____________ Start time: ____________ Finish time: ____________ °Date: ____________ Movements: ____________ Start time: ____________ Finish time: ____________ °Date: ____________ Movements: ____________ Start time: ____________   Finish time: ____________ °Date: ____________ Movements: ____________ Start  time: ____________ Finish time: ____________ °Date: ____________ Movements: ____________ Start time: ____________ Finish time: ____________  °Date: ____________ Movements: ____________ Start time: ____________ Finish time: ____________ °Date: ____________ Movements: ____________ Start time: ____________ Finish time: ____________ °Date: ____________ Movements: ____________ Start time: ____________ Finish time: ____________ °Date: ____________ Movements: ____________ Start time: ____________ Finish time: ____________ °Date: ____________ Movements: ____________ Start time: ____________ Finish time: ____________ °Date: ____________ Movements: ____________ Start time: ____________ Finish time: ____________ °Date: ____________ Movements: ____________ Start time: ____________ Finish time: ____________  °Date: ____________ Movements: ____________ Start time: ____________ Finish time: ____________ °Date: ____________ Movements: ____________ Start time: ____________ Finish time: ____________ °Date: ____________ Movements: ____________ Start time: ____________ Finish time: ____________ °Date: ____________ Movements: ____________ Start time: ____________ Finish time: ____________ °Date: ____________ Movements: ____________ Start time: ____________ Finish time: ____________ °Date: ____________ Movements: ____________ Start time: ____________ Finish time: ____________ °Date: ____________ Movements: ____________ Start time: ____________ Finish time: ____________  °Date: ____________ Movements: ____________ Start time: ____________ Finish time: ____________ °Date: ____________ Movements: ____________ Start time: ____________ Finish time: ____________ °Date: ____________ Movements: ____________ Start time: ____________ Finish time: ____________ °Date: ____________ Movements: ____________ Start time: ____________ Finish time: ____________ °Date: ____________ Movements: ____________ Start time: ____________ Finish time:  ____________ °Date: ____________ Movements: ____________ Start time: ____________ Finish time: ____________ °Date: ____________ Movements: ____________ Start time: ____________ Finish time: ____________  °Date: ____________ Movements: ____________ Start time: ____________ Finish time: ____________ °Date: ____________ Movements: ____________ Start time: ____________ Finish time: ____________ °Date: ____________ Movements: ____________ Start time: ____________ Finish time: ____________ °Date: ____________ Movements: ____________ Start time: ____________ Finish time: ____________ °Date: ____________ Movements: ____________ Start time: ____________ Finish time: ____________ °Date: ____________ Movements: ____________ Start time: ____________ Finish time: ____________ °  °This information is not intended to replace advice given to you by your health care provider. Make sure you discuss any questions you have with your health care provider. °  °Document Released: 10/19/2006 Document Revised: 10/10/2014 Document Reviewed: 07/16/2012 °Elsevier Interactive Patient Education ©2016 [employer] °Braxton Hicks Contractions °Contractions of the uterus can occur throughout pregnancy. Contractions are not always a sign that you are in labor.  °WHAT ARE BRAXTON HICKS CONTRACTIONS?  °Contractions that occur before labor are called Braxton Hicks contractions, or false labor. Toward the end of pregnancy (32-34 weeks), these contractions can develop more often and may become more forceful. This is not true labor because these contractions do not result in opening (dilatation) and thinning of the cervix. They are sometimes difficult to tell apart from true labor because these contractions can be forceful and people have different pain tolerances. You should not feel embarrassed if you go to the hospital with false labor. Sometimes, the only way to tell if you are in true labor is for your health care provider to look for changes in the  cervix. °If there are no prenatal problems or other health problems associated with the pregnancy, it is completely safe to be sent home with false labor and await the onset of true labor. °HOW CAN YOU TELL THE DIFFERENCE BETWEEN TRUE AND FALSE LABOR? °False Labor °· The contractions of false labor are usually shorter and not as hard as those of true labor.   °· The contractions are usually irregular.   °· The contractions are often felt in the front of the lower abdomen and in the groin.   °· The contractions may go away when you walk around or change positions while lying down.   °· The contractions get weaker   and are shorter lasting as time goes on.   °· The contractions do not usually become progressively stronger, regular, and closer together as with true labor.   °True Labor °· Contractions in true labor last 30-70 seconds, become very regular, usually become more intense, and increase in frequency.   °· The contractions do not go away with walking.   °· The discomfort is usually felt in the top of the uterus and spreads to the lower abdomen and low back.   °· True labor can be determined by your health care provider with an exam. This will show that the cervix is dilating and getting thinner.   °WHAT TO REMEMBER °· Keep up with your usual exercises and follow other instructions given by your health care provider.   °· Take medicines as directed by your health care provider.   °· Keep your regular prenatal appointments.   °· Eat and drink lightly if you think you are going into labor.   °· If Braxton Hicks contractions are making you uncomfortable:   °· Change your position from lying down or resting to walking, or from walking to resting.   °· Sit and rest in a tub of warm water.   °· Drink 2-3 glasses of water. Dehydration may cause these contractions.   °· Do slow and deep breathing several times an hour.   °WHEN SHOULD I SEEK IMMEDIATE MEDICAL CARE? °Seek immediate medical care if: °· Your contractions  become stronger, more regular, and closer together.   °· You have fluid leaking or gushing from your vagina.   °· You have a fever.   °· You pass blood-tinged mucus.   °· You have vaginal bleeding.   °· You have continuous abdominal pain.   °· You have low back pain that you never had before.   °· You feel your baby's head pushing down and causing pelvic pressure.   °· Your baby is not moving as much as it used to.   °  °This information is not intended to replace advice given to you by your health care provider. Make sure you discuss any questions you have with your health care provider. °  °Document Released: 09/19/2005 Document Revised: 09/24/2013 Document Reviewed: 07/01/2013 °Elsevier Interactive Patient Education ©2016 [employer] Inc. ° °

## 2015-08-02 ENCOUNTER — Encounter (HOSPITAL_COMMUNITY): Payer: Self-pay | Admitting: *Deleted

## 2015-08-02 ENCOUNTER — Inpatient Hospital Stay (HOSPITAL_COMMUNITY)
Admission: AD | Admit: 2015-08-02 | Discharge: 2015-08-02 | Disposition: A | Discharge status: Home / Self Care | Payer: Medicaid Other | Source: Ambulatory Visit | Attending: Obstetrics and Gynecology | Admitting: Obstetrics and Gynecology

## 2015-08-02 DIAGNOSIS — O99820 Streptococcus B carrier state complicating pregnancy: Secondary | ICD-10-CM | POA: Insufficient documentation

## 2015-08-02 DIAGNOSIS — Z3A39 39 weeks gestation of pregnancy: Secondary | ICD-10-CM | POA: Diagnosis not present

## 2015-08-02 DIAGNOSIS — Z3490 Encounter for supervision of normal pregnancy, unspecified, unspecified trimester: Secondary | ICD-10-CM

## 2015-08-02 DIAGNOSIS — O471 False labor at or after 37 completed weeks of gestation: Secondary | ICD-10-CM | POA: Diagnosis present

## 2015-08-02 LAB — AMNISURE RUPTURE OF MEMBRANE (ROM) NOT AT ARMC: AMNISURE: NEGATIVE

## 2015-08-02 NOTE — [facility] Provider Note (Signed)
  History  24 yo G2P0 @ 39.5 wks presents unannounced to [facility] w/ c/o LOF. +FM. Denies ctxs or bleeding.  PROBLEM LIST: False Labor --- 08/02/15 GBS Positive --- 08/02/15  Chief Complaint  Patient presents with  . Rupture of Membranes   HPI As above OB History    Gravida Para Term Preterm AB TAB SAB Ectopic Multiple Living   2         1      Past Medical History  Diagnosis Date  . Medical history non-contributory     Past Surgical History  Procedure Laterality Date  . No past surgeries      Family History  Problem Relation Age of Onset  . Hyperlipidemia Father   . Diabetes Father   . Diabetes Other   . Hyperlipidemia Other   . Heart failure Other     Social History  Substance Use Topics  . Smoking status: Never Smoker   . Smokeless tobacco: None  . Alcohol Use: Yes     Comment: social before preg    Allergies: No Known Allergies  Prescriptions prior to admission  Medication Sig Dispense Refill Last Dose  . acetaminophen (TYLENOL) 500 MG tablet Take 1,000 mg by mouth every 6 (six) hours as needed for headache.    Past Week at Unknown time  . cetirizine (ZYRTEC) 10 MG tablet Take 10 mg by mouth daily as needed for allergies.   Past Week at Unknown time  . cyclobenzaprine (FLEXERIL) 5 MG tablet Take 5-10 mg by mouth every 8 (eight) hours as needed for muscle spasms.   More than a month at Unknown time  . omeprazole (PRILOSEC) 20 MG capsule Take 20 mg by mouth daily as needed (acid reflux).   More than a month at Unknown time  . Prenatal Vit-Fe Fumarate-FA (PNV PRENATAL PLUS MULTIVITAMIN PO) Take 1 tablet by mouth daily.   08/01/2015 at Unknown time    ROS  ? LOF + FM - VB - Ctxs Physical Exam   Blood pressure 122/79, pulse 87, temperature 97.7 F (36.5 C), temperature source Oral, resp. rate 18, height 5\' 4"  (1.626 m), weight 74.844 kg (165 lb), last menstrual period 10/28/2014, SpO2 100 %.    Physical Exam Gen: NAD Abdomen: gravid, soft, NT SSE: Neg  pooling, neg valsalva, neg ferning   Cvx: 4/50/-3 - no change from exam 10 days ago  FHRT: BL 140 w/ moderate variability, +accels, no decels Ctxs: None ED Course  Assessment: Intact BOW Cat 1 FHRT  Plan: D/C home w/ strict labor precautions Continue [facility] OB f/u as previously scheduled   Sherre ScarletWILLIAMS, Angy Swearengin CNM, MS 08/02/15, 3:50 AM

## 2015-08-02 NOTE — Progress Notes (Signed)
Kimberly Williams CNM in earlier to discuss test results and d/c plan. Written and verbal d/c instructions given and understanding voiced. 

## 2015-08-02 NOTE — Progress Notes (Signed)
Caryl Alicia Singleton CNM aware of pt's admission and status. Amnisure ordered and will see pt

## 2015-08-02 NOTE — [facility] Note (Signed)
Pt reports leaking fluid since yesterday.

## 2015-08-04 ENCOUNTER — Inpatient Hospital Stay (HOSPITAL_COMMUNITY)
Admission: AD | Admit: 2015-08-04 | Discharge: 2015-08-06 | DRG: 775 | Disposition: A | Discharge status: Home / Self Care | Payer: Medicaid Other | Source: Ambulatory Visit | Attending: Obstetrics and Gynecology | Admitting: Obstetrics and Gynecology

## 2015-08-04 ENCOUNTER — Encounter (HOSPITAL_COMMUNITY): Payer: Self-pay | Admitting: *Deleted

## 2015-08-04 ENCOUNTER — Inpatient Hospital Stay (HOSPITAL_COMMUNITY): Payer: Medicaid Other | Admitting: Anesthesiology

## 2015-08-04 DIAGNOSIS — Z833 Family history of diabetes mellitus: Secondary | ICD-10-CM

## 2015-08-04 DIAGNOSIS — O99824 Streptococcus B carrier state complicating childbirth: Secondary | ICD-10-CM | POA: Diagnosis present

## 2015-08-04 DIAGNOSIS — B009 Herpesviral infection, unspecified: Secondary | ICD-10-CM | POA: Diagnosis present

## 2015-08-04 DIAGNOSIS — Z3A40 40 weeks gestation of pregnancy: Secondary | ICD-10-CM | POA: Diagnosis not present

## 2015-08-04 DIAGNOSIS — Z8249 Family history of ischemic heart disease and other diseases of the circulatory system: Secondary | ICD-10-CM

## 2015-08-04 DIAGNOSIS — O480 Post-term pregnancy: Principal | ICD-10-CM | POA: Diagnosis present

## 2015-08-04 DIAGNOSIS — Z8619 Personal history of other infectious and parasitic diseases: Secondary | ICD-10-CM | POA: Diagnosis present

## 2015-08-04 DIAGNOSIS — B951 Streptococcus, group B, as the cause of diseases classified elsewhere: Secondary | ICD-10-CM | POA: Diagnosis present

## 2015-08-04 HISTORY — DX: Streptococcus B carrier state complicating pregnancy: O99.820

## 2015-08-04 HISTORY — DX: Herpesviral infection, unspecified: B00.9

## 2015-08-04 LAB — CBC
HEMATOCRIT: 37 % (ref 36.0–46.0)
Hemoglobin: 12.7 g/dL (ref 12.0–15.0)
MCH: 30.5 pg (ref 26.0–34.0)
MCHC: 34.3 g/dL (ref 30.0–36.0)
MCV: 88.9 fL (ref 78.0–100.0)
Platelets: 297 10*3/uL (ref 150–400)
RBC: 4.16 MIL/uL (ref 3.87–5.11)
RDW: 13.8 % (ref 11.5–15.5)
WBC: 14.4 10*3/uL — ABNORMAL HIGH (ref 4.0–10.5)

## 2015-08-04 LAB — TYPE AND SCREEN
ABO/RH(D): O POS
Antibody Screen: NEGATIVE

## 2015-08-04 MED ORDER — MISOPROSTOL 200 MCG PO TABS
1000.0000 ug | ORAL_TABLET | Freq: Once | ORAL | Status: AC
  Administered 2015-08-04: 1000 ug via VAGINAL

## 2015-08-04 MED ORDER — EPHEDRINE 5 MG/ML INJ
10.0000 mg | INTRAVENOUS | Status: DC | PRN
  Filled 2015-08-04: qty 2
  Filled 2015-08-04: qty 4

## 2015-08-04 MED ORDER — DIPHENHYDRAMINE HCL 25 MG PO CAPS: 25.0000 mg | ORAL_CAPSULE | Freq: Four times a day (QID) | ORAL | Status: DC | PRN

## 2015-08-04 MED ORDER — DIBUCAINE 1 % RE OINT: 1.0000 "application " | TOPICAL_OINTMENT | RECTAL | Status: DC | PRN

## 2015-08-04 MED ORDER — LIDOCAINE HCL (PF) 1 % IJ SOLN
30.0000 mL | INTRAMUSCULAR | Status: DC | PRN
  Filled 2015-08-04: qty 30

## 2015-08-04 MED ORDER — OXYCODONE-ACETAMINOPHEN 5-325 MG PO TABS: 2.0000 | ORAL_TABLET | ORAL | Status: DC | PRN

## 2015-08-04 MED ORDER — LACTATED RINGERS IV SOLN
500.0000 mL | INTRAVENOUS | Status: DC | PRN
  Administered 2015-08-04: 800 mL via INTRAVENOUS

## 2015-08-04 MED ORDER — WITCH HAZEL-GLYCERIN EX PADS: 1.0000 "application " | MEDICATED_PAD | CUTANEOUS | Status: DC | PRN

## 2015-08-04 MED ORDER — FLEET ENEMA 7-19 GM/118ML RE ENEM: 1.0000 | ENEMA | RECTAL | Status: DC | PRN

## 2015-08-04 MED ORDER — OXYTOCIN BOLUS FROM INFUSION
500.0000 mL | INTRAVENOUS | Status: DC
  Administered 2015-08-04: 500 mL via INTRAVENOUS

## 2015-08-04 MED ORDER — ONDANSETRON HCL 4 MG/2ML IJ SOLN: 4.0000 mg | Freq: Four times a day (QID) | INTRAMUSCULAR | Status: DC | PRN

## 2015-08-04 MED ORDER — FENTANYL 2.5 MCG/ML BUPIVACAINE 1/10 % EPIDURAL INFUSION (WH - ANES)
14.0000 mL/h | INTRAMUSCULAR | Status: DC | PRN
  Administered 2015-08-04 (×2): 14 mL/h via EPIDURAL
  Filled 2015-08-04: qty 125

## 2015-08-04 MED ORDER — SODIUM CHLORIDE 0.9 % IV SOLN
2.0000 g | Freq: Once | INTRAVENOUS | Status: AC
  Administered 2015-08-04: 2 g via INTRAVENOUS
  Filled 2015-08-04: qty 2000

## 2015-08-04 MED ORDER — MISOPROSTOL 200 MCG PO TABS
ORAL_TABLET | ORAL | Status: AC
  Filled 2015-08-04: qty 5

## 2015-08-04 MED ORDER — PENICILLIN G POTASSIUM 5000000 UNITS IJ SOLR
5.0000 10*6.[IU] | Freq: Once | INTRAVENOUS | Status: DC
  Filled 2015-08-04: qty 5

## 2015-08-04 MED ORDER — LANOLIN HYDROUS EX OINT: TOPICAL_OINTMENT | CUTANEOUS | Status: DC | PRN

## 2015-08-04 MED ORDER — ONDANSETRON HCL 4 MG/2ML IJ SOLN: 4.0000 mg | INTRAMUSCULAR | Status: DC | PRN

## 2015-08-04 MED ORDER — OXYTOCIN 40 UNITS IN LACTATED RINGERS INFUSION - SIMPLE MED
62.5000 mL/h | INTRAVENOUS | Status: DC
  Filled 2015-08-04: qty 1000

## 2015-08-04 MED ORDER — DIPHENHYDRAMINE HCL 50 MG/ML IJ SOLN
12.5000 mg | INTRAMUSCULAR | Status: DC | PRN
Start: 2015-08-04 — End: 2015-08-04

## 2015-08-04 MED ORDER — PHENYLEPHRINE 40 MCG/ML (10ML) SYRINGE FOR IV PUSH (FOR BLOOD PRESSURE SUPPORT)
80.0000 ug | PREFILLED_SYRINGE | INTRAVENOUS | Status: DC | PRN
  Filled 2015-08-04: qty 20
  Filled 2015-08-04: qty 2

## 2015-08-04 MED ORDER — ONDANSETRON HCL 4 MG PO TABS: 4.0000 mg | ORAL_TABLET | ORAL | Status: DC | PRN

## 2015-08-04 MED ORDER — CITRIC ACID-SODIUM CITRATE 334-500 MG/5ML PO SOLN: 30.0000 mL | ORAL | Status: DC | PRN

## 2015-08-04 MED ORDER — PRENATAL MULTIVITAMIN CH
1.0000 | ORAL_TABLET | Freq: Every day | ORAL | Status: DC
  Administered 2015-08-05: 1 via ORAL
  Filled 2015-08-04: qty 1

## 2015-08-04 MED ORDER — TETANUS-DIPHTH-ACELL PERTUSSIS 5-2.5-18.5 LF-MCG/0.5 IM SUSP: 0.5000 mL | Freq: Once | INTRAMUSCULAR | Status: DC

## 2015-08-04 MED ORDER — ACETAMINOPHEN 325 MG PO TABS: 650.0000 mg | ORAL_TABLET | ORAL | Status: DC | PRN

## 2015-08-04 MED ORDER — LIDOCAINE HCL (PF) 1 % IJ SOLN
INTRAMUSCULAR | Status: DC | PRN
  Administered 2015-08-04 (×2): 4 mL via EPIDURAL

## 2015-08-04 MED ORDER — IBUPROFEN 600 MG PO TABS
600.0000 mg | ORAL_TABLET | Freq: Four times a day (QID) | ORAL | Status: DC
Start: 2015-08-05 — End: 2015-08-06
  Administered 2015-08-04 – 2015-08-06 (×6): 600 mg via ORAL
  Filled 2015-08-04 (×6): qty 1

## 2015-08-04 MED ORDER — BENZOCAINE-MENTHOL 20-0.5 % EX AERO
1.0000 "application " | INHALATION_SPRAY | CUTANEOUS | Status: DC | PRN
  Administered 2015-08-05: 1 via TOPICAL
  Filled 2015-08-04: qty 56

## 2015-08-04 MED ORDER — OXYCODONE-ACETAMINOPHEN 5-325 MG PO TABS: 1.0000 | ORAL_TABLET | ORAL | Status: DC | PRN

## 2015-08-04 MED ORDER — LACTATED RINGERS IV SOLN
INTRAVENOUS | Status: DC
  Administered 2015-08-04: 950 mL via INTRAVENOUS

## 2015-08-04 MED ORDER — OXYCODONE-ACETAMINOPHEN 5-325 MG PO TABS
1.0000 | ORAL_TABLET | ORAL | Status: DC | PRN
  Administered 2015-08-05 – 2015-08-06 (×2): 1 via ORAL
  Filled 2015-08-04 (×2): qty 1

## 2015-08-04 MED ORDER — ZOLPIDEM TARTRATE 5 MG PO TABS: 5.0000 mg | ORAL_TABLET | Freq: Every evening | ORAL | Status: DC | PRN

## 2015-08-04 MED ORDER — METHYLERGONOVINE MALEATE 0.2 MG/ML IJ SOLN
0.2000 mg | Freq: Once | INTRAMUSCULAR | Status: AC
  Administered 2015-08-04: 0.2 mg via INTRAMUSCULAR

## 2015-08-04 MED ORDER — SIMETHICONE 80 MG PO CHEW: 80.0000 mg | CHEWABLE_TABLET | ORAL | Status: DC | PRN

## 2015-08-04 MED ORDER — SENNOSIDES-DOCUSATE SODIUM 8.6-50 MG PO TABS
2.0000 | ORAL_TABLET | ORAL | Status: DC
  Administered 2015-08-04 – 2015-08-06 (×2): 2 via ORAL
  Filled 2015-08-04 (×2): qty 2

## 2015-08-04 MED ORDER — PENICILLIN G POTASSIUM 5000000 UNITS IJ SOLR
2.5000 10*6.[IU] | INTRAVENOUS | Status: DC
  Filled 2015-08-04 (×3): qty 2.5

## 2015-08-04 NOTE — Anesthesia Preprocedure Evaluation (Addendum)
Anesthesia Evaluation  Patient identified by MRN, date of birth, ID band Patient awake    Reviewed: Allergy & Precautions, NPO status , Patient's Chart, lab work & pertinent test results  Airway Mallampati: II  TM Distance: >3 FB Neck ROM: Full    Dental  (+) Teeth Intact   Pulmonary neg pulmonary ROS,    breath sounds clear to auscultation       Cardiovascular negative cardio ROS   Rhythm:Regular Rate:Normal     Neuro/Psych negative neurological ROS  negative psych ROS   GI/Hepatic Neg liver ROS, GERD  Medicated,  Endo/Other  negative endocrine ROS  Renal/GU negative Renal ROS  negative genitourinary   Musculoskeletal negative musculoskeletal ROS (+)   Abdominal   Peds negative pediatric ROS (+)  Hematology negative hematology ROS (+)   Anesthesia Other Findings   Reproductive/Obstetrics (+) Pregnancy                            Lab Results  Component Value Date   WBC 14.4* 08/04/2015   HGB 12.7 08/04/2015   HCT 37.0 08/04/2015   MCV 88.9 08/04/2015   PLT 297 08/04/2015   No results found for: INR, PROTIME   Anesthesia Physical Anesthesia Plan  ASA: II  Anesthesia Plan: Epidural   Post-op Pain Management:    Induction:   Airway Management Planned:   Additional Equipment:   Intra-op Plan:   Post-operative Plan:   Informed Consent: I have reviewed the patients History and Physical, chart, labs and discussed the procedure including the risks, benefits and alternatives for the proposed anesthesia with the patient or authorized representative who has indicated his/her understanding and acceptance.     Plan Discussed with:   Anesthesia Plan Comments:         Anesthesia Quick Evaluation

## 2015-08-04 NOTE — H&P (Signed)
Alicia Singleton is a 24 y.o. female, G2P1 at [redacted]w[redacted]d  presenting for Labor.  Pt seen in office this morning 5/90/-2 wiith BBOW and sent to hospital.  Currently very uncomfortable and tearful with contractions.  Desires an epidural.     Patient Active Problem List   Diagnosis Date Noted  . Normal labor 08/04/2015  . Pregnancy 12/08/2014    History of present pregnancy: Patient entered care at 8.1 weeks.   EDC of 110/10/2014  was established by LMP .   Anatomy scan:  [redacted]w[redacted]d weeks, with normal findings and posterior placenta.  Vertex prsentation, placental edge 5.4 cm from internal os. Cervix closed. Fluid is normal with vertical pocket=4.3cm.  All anatomy seen.  Additional [place] evaluations:  * Dating [redacted]w[redacted]d 12/24/14:  CRL is c/w LMP GA *  33.2weeks  BPP 8/8  (06/23/15)  Normal amniotic fluid   Significant prenatal events:  1rst Trimester:  GBS positive in urine  2nd: 17P injections for hx of preterm delivery at 34 weeks 3rd trimester: sciatica pain Last evaluation:  08/04/15  Pt c/o painful contractions that started around 10 am. Pt also states baby is moving but that movements have not been as strong since this morning. Pt was seen at [facility]U on the previous Saturday (08/01/15) for increased discharge. Unable to void for urine specimen at work up. CP CMA A/P: BBOW. Pt. to hospital. Dr. Alinda Sierras notified.   OB History    Gravida Para Term Preterm AB TAB SAB Ectopic Multiple Living   2         1     Past Medical History  Diagnosis Date  . Medical history non-contributory    Past Surgical History  Procedure Laterality Date  . No past surgeries     Family History: family history includes Diabetes in her father and other; Heart failure in her other; Hyperlipidemia in her father and other. Social History:  reports that she has never smoked. She does not have any smokeless tobacco history on file. She reports that she drinks alcohol. She reports that she does not use illicit drugs.   Prenatal Transfer Tool   Maternal Diabetes: No Genetic Screening: Normal Maternal Ultrasounds/Referrals: Normal Fetal Ultrasounds or other Referrals:  None Maternal Substance Abuse:  No Significant Maternal Medications:  None Significant Maternal Lab Results: Lab values include: Group B Strep positive  TDAP 05/07/15 Flu 07/07/15  ROS:   Contraction   No Known Allergies   Dilation: 7 Effacement (%): 90 Station: -1 Exam by::  Alexandre Faries cnm Blood pressure 117/78, pulse 94, temperature 97.9 F (36.6 C), temperature source Oral, resp. rate 20, height  (1.626 m), weight 74.844 kg (165 lb), last menstrual period 10/28/2014.  Chest clear Heart RRR without murmur Abd gravid, NT Pelvic: adequate, proven to  Ext: no edema noted  FHR: Category 1 , 140 bpm, moderate variability, +accels UCs:  Regular, every 2-4 minutes, palpate strong  Prenatal labs: ABO, Rh: O/Positive/-- (03/15 0000) Antibody: Negative (03/15 0000) Rubella:  !Error! 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

## 2015-08-04 NOTE — Anesthesia Procedure Notes (Signed)
Epidural Patient location during procedure: OB Start time: 08/04/2015 3:38 PM End time: 08/04/2015 3:46 PM  Staffing Anesthesiologist: Shona SimpsonHOLLIS, KEVIN D Performed by: anesthesiologist   Preanesthetic Checklist Completed: patient identified, site marked, surgical consent, pre-op evaluation, timeout performed, IV checked, risks and benefits discussed and monitors and equipment checked  Epidural Patient position: sitting Prep: DuraPrep Patient monitoring: heart rate, continuous pulse ox and blood pressure Approach: midline Location: L4-L5 Injection technique: LOR saline  Needle:  Needle type: Tuohy  Needle gauge: 18 G Needle length: 9 cm and 9 Catheter type: closed end flexible Catheter size: 20 Guage Test dose: negative and Other  Assessment Events: blood not aspirated, injection not painful, no injection resistance, negative IV test and no paresthesia  Additional Notes LOR @ 5  Patient identified. Risks/Benefits/Options discussed with patient including but not limited to bleeding, infection, nerve damage, paralysis, failed block, incomplete pain control, headache, blood pressure changes, nausea, vomiting, reactions to medications, itching and postpartum back pain. Confirmed with bedside nurse the patient's most recent platelet count. Confirmed with patient that they are not currently taking any anticoagulation, have any bleeding history or any family history of bleeding disorders. Patient expressed understanding and wished to proceed. All questions were answered. Sterile technique was used throughout the entire procedure. Please see nursing notes for vital signs. Test dose was given through epidural catheter and negative prior to continuing to dose epidural or start infusion. Warning signs of high block given to the patient including shortness of breath, tingling/numbness in hands, complete motor block, or any concerning symptoms with instructions to call for help. Patient was given  instructions on fall risk and not to get out of bed. All questions and concerns addressed with instructions to call with any issues or inadequate analgesia.     Patient tolerated the insertion well without complications.Reason for block:procedure for pain

## 2015-08-05 LAB — CBC
HEMATOCRIT: 30.5 % — AB (ref 36.0–46.0)
Hemoglobin: 10.3 g/dL — ABNORMAL LOW (ref 12.0–15.0)
MCH: 30.2 pg (ref 26.0–34.0)
MCHC: 33.8 g/dL (ref 30.0–36.0)
MCV: 89.4 fL (ref 78.0–100.0)
PLATELETS: 258 10*3/uL (ref 150–400)
RBC: 3.41 MIL/uL — ABNORMAL LOW (ref 3.87–5.11)
RDW: 13.9 % (ref 11.5–15.5)
WBC: 17.9 10*3/uL — AB (ref 4.0–10.5)

## 2015-08-05 LAB — RPR: RPR: NONREACTIVE

## 2015-08-05 NOTE — Anesthesia Postprocedure Evaluation (Signed)
Anesthesia Post Note  Patient: Alicia Singleton  Procedure(s) Performed: * No procedures listed *  Anesthesia type: Epidural  Patient location: Mother/Baby  Post pain: Pain level controlled  Post assessment: Post-op Vital signs reviewed  Last Vitals:  Filed Vitals:   08/05/15 0300  BP: 107/69  Pulse: 72  Temp: 37 C  Resp: 18    Post vital signs: Reviewed  Level of consciousness:alert  Complications: No apparent anesthesia complications

## 2015-08-05 NOTE — Progress Notes (Signed)
Subjective: Postpartum Day 1 Vaginal delivery, 2 degree laceration Patient up ad lib, reports no syncope or dizziness. Feeding:  breast Contraceptive plan:  Nexplanon  Objective: Vital signs in last 24 hours: Temp:  [98.2 F (36.8 C)-98.6 F (37 C)] 98.4 F (36.9 C) (11/02 1015) Pulse Rate:  [72-140] 81 (11/02 1015) Resp:  [18-20] 18 (11/02 1015) BP: (87-137)/(46-88) 109/69 mmHg (11/02 1015) SpO2:  [77 %-100 %] 98 % (11/02 0300)  Physical Exam:  General: alert and cooperative Lochia: appropriate Uterine Fundus: firm Perineum: healing well DVT Evaluation: No evidence of DVT seen on physical exam.   CBC Latest Ref Rng 08/05/2015 08/04/2015 05/07/2015  WBC 4.0 - 10.5 K/uL 17.9(H) 14.4(H) -  Hemoglobin 12.0 - 15.0 g/dL 10.3(L) 12.7 11.7  Hematocrit 36.0 - 46.0 % 30.5(L) 37.0 34  Platelets 150 - 400 K/uL 258 297 -   Assessment/Plan: Status post vaginal delivery day 1. Stable Continue current care. Desires outpatient circumcision Plan for discharge tomorrow    Beatrix FettersRachel StallCNM 08/05/2015, 4:46 PM

## 2015-08-05 NOTE — Lactation Note (Signed)
This note was copied from the chart of Alicia Anastasia PallKasey Angulo. Lactation Consultation Note  Patient Name: Alicia Singleton: 08/05/2015 Reason for consult: Initial assessment This is Mom's 1st time BF, 1st baby LPT - NICU. Baby latched at this visit and demonstrated good suckling bursts with few noted swallows. Mom reports some mild nipple tenderness, no breakdown noted. Reviewed with Mom how to obtain depth with initial latch, not to let baby nibble with latching on breast. Basic teaching reviewed with Mom. Encouraged to BF with feeding ques. Lactation brochure left for review, advised of OP services and support group. Care for sore nipples reviewed.   Maternal Data Has patient been taught Hand Expression?: Yes Does the patient have breastfeeding experience prior to this delivery?: No  Feeding Feeding Type: Breast Fed  LATCH Score/Interventions Latch: Grasps breast easily, tongue down, lips flanged, rhythmical sucking.  Audible Swallowing: A few with stimulation  Type of Nipple: Everted at rest and after stimulation  Comfort (Breast/Nipple): Filling, red/small blisters or bruises, mild/mod discomfort  Problem noted: Mild/Moderate discomfort  Hold (Positioning): Assistance needed to correctly position infant at breast and maintain latch. Intervention(s): Breastfeeding basics reviewed;Support Pillows;Position options;Skin to skin  LATCH Score: 7  Lactation Tools Discussed/Used WIC Program: No   Consult Status Consult Status: Follow-up Singleton: 08/06/15 Follow-up type: In-patient    Alicia Singleton, Alicia Singleton 08/05/2015, 12:24 PM

## 2015-08-06 MED ORDER — OXYCODONE-ACETAMINOPHEN 5-325 MG PO TABS: 1.0000 | ORAL_TABLET | ORAL | Status: DC | PRN

## 2015-08-06 MED ORDER — IBUPROFEN 600 MG PO TABS: 600.0000 mg | ORAL_TABLET | Freq: Four times a day (QID) | ORAL | Status: DC | PRN

## 2015-08-06 NOTE — Lactation Note (Signed)
This note was copied from the chart of Alicia Singleton [place]. Lactation Consultation Note Mom had pumped and bottle fed her first child for 3 months. Mom hopes to BF this baby until 2 months then pump and bottle feed longer after she returns to work. [facility] Gave hand pump. Hand expression demonstrated colostrum. Baby is cluster feeding. Answered questions mom had. Reminded about OP services and support group. Patient Name: Alicia Singleton Kuiken ZOXWR'UToday's Date: 08/06/2015 Reason for consult: Follow-up assessment   Maternal Data    Feeding Feeding Type: Breast Fed Length of feed: 25 min  LATCH Score/Interventions Latch: Grasps breast easily, tongue down, lips flanged, rhythmical sucking.  Audible Swallowing: Spontaneous and intermittent Intervention(s): Skin to skin;Hand expression;Alternate breast massage  Type of Nipple: Everted at rest and after stimulation  Comfort (Breast/Nipple): Filling, red/small blisters or bruises, mild/mod discomfort  Problem noted: Mild/Moderate discomfort Interventions (Mild/moderate discomfort): Post-pump;Hand massage;Hand expression  Hold (Positioning): No assistance needed to correctly position infant at breast. Intervention(s): Support Pillows;Position options;Skin to skin;Breastfeeding basics reviewed  LATCH Score: 9  Lactation Tools Discussed/Used Tools: Pump Breast pump type: Manual [facility]IC Program: No [facility] Pump Review: Setup, frequency, and cleaning;Milk Storage Initiated by:: Peri JeffersonL. Jaileigh Weimer RN Date initiated:: 08/06/15   Consult Status Consult Status: Complete Date: 08/06/15    Charyl DancerCARVER, Redell Nazir G 08/06/2015, 5:18 AM

## 2015-08-06 NOTE — Progress Notes (Signed)
Pt verbalizes understanding of d/c instructions, medications, follow up appts, medications, when to seek medical advice and belongings policy. I reviewed Baby and Me Book with mom and answered all questions. Pts mom, MIL and SO are in the room and will be assisting her home and when she is at home FOB will be helping her.  Sheryn BisonGordon, Christoher Drudge Warner

## 2015-08-06 NOTE — Discharge Summary (Signed)
OB Discharge Summary     Patient Name: Alicia ButteryKasey L Milsap DOB: 04/21/1991 MRN: [id number]  Date of admission: 08/04/2015 Delivering MD: Osborn CohoOBERTS, ANGELA   Date of discharge: 08/06/2015  Admitting diagnosis: LABOR Intrauterine pregnancy: 7056w0d     Secondary diagnosis:  Principal Problem:   Vaginal delivery Active Problems:   Group beta Strep positive   History of herpes simplex infection  Additional problems: none     Discharge diagnosis: Term Pregnancy Delivered                                                                                                Post partum procedures:none  Augmentation: AROM  Complications: None  Hospital course:  Onset of Labor With Vaginal Delivery     24 y.o. yo [id number] at 3256w0d was admitted in Active Laboron 08/04/2015. Patient had an uncomplicated labor course as follows:  Membrane Rupture Time/Date: 6:59 PM ,08/04/2015   Intrapartum Procedures: Episiotomy: None [1]                                         Lacerations:  2nd degree [3];Perineal [11];Labial [10]  Patient had a delivery of a Viable infant. 08/04/2015  Information for the patient's newborn:  Paulette BlanchDaniels, Boy Winry [[id number]][[id number]]  Delivery Method: Vag-Spont     Pateint had an uncomplicated postpartum course.  She is ambulating, tolerating a regular diet, passing flatus, and urinating well. Patient is discharged home in stable condition on No discharge date for patient encounter.Marland Kitchen.    Physical exam  Filed Vitals:   08/05/15 0300 08/05/15 1015 08/05/15 1823 08/06/15 0544  BP: 107/69 109/69 111/67 98/76  Pulse: 72 81 77 76  Temp: 98.6 F (37 C) 98.4 F (36.9 C) 98.4 F (36.9 C) 98 F (36.7 C)  TempSrc: Oral Oral Oral Oral  Resp: 18 18 18 16   Height:      Weight:      SpO2: 98%      General: alert and cooperative Lochia: appropriate Uterine Fundus: firm Incision: Healing well with no significant drainage DVT Evaluation: No evidence of DVT seen on physical exam. Labs: Lab  Results  Component Value Date   WBC 17.9* 08/05/2015   HGB 10.3* 08/05/2015   HCT 30.5* 08/05/2015   MCV 89.4 08/05/2015   PLT 258 08/05/2015   CMP Latest Ref Rng 01/29/2012  Glucose 70 - 99 mg/dL 93  BUN 6 - 23 mg/dL 12  Creatinine 2.720.50 - 5.361.10 mg/dL 6.440.80  Sodium 034135 - 742145 mEq/L 141  Potassium 3.5 - 5.1 mEq/L 3.3(L)  Chloride 96 - 112 mEq/L 106  CO2 19 - 32 mEq/L -  Calcium 8.4 - 10.5 mg/dL -  Total Protein 6.0 - 8.3 g/dL -  Total Bilirubin 0.3 - 1.2 mg/dL -  Alkaline Phos 39 - 595117 U/L -  AST 0 - 37 U/L -  ALT 0 - 35 U/L -    Discharge instruction: per After Visit Summary and "Baby and Me Booklet".  Medications:  Current  facility-administered medications:  .  acetaminophen (TYLENOL) tablet 650 mg, 650 mg, Oral, Q4H PRN, Nigel Bridgeman, CNM .  benzocaine-Menthol (DERMOPLAST) 20-0.5 % topical spray 1 application, 1 application, Topical, PRN, Nigel Bridgeman, CNM, 1 application at 08/05/15 1748 .  witch hazel-glycerin (TUCKS) pad 1 application, 1 application, Topical, PRN **AND** dibucaine (NUPERCAINAL) 1 % rectal ointment 1 application, 1 application, Rectal, PRN, Nigel Bridgeman, CNM .  diphenhydrAMINE (BENADRYL) capsule 25 mg, 25 mg, Oral, Q6H PRN, Nigel Bridgeman, CNM .  ibuprofen (ADVIL,MOTRIN) tablet 600 mg, 600 mg, Oral, 4 times per day, Nigel Bridgeman, CNM, 600 mg at 08/06/15 0532 .  lanolin ointment, , Topical, PRN, Nigel Bridgeman, CNM .  ondansetron (ZOFRAN) tablet 4 mg, 4 mg, Oral, Q4H PRN **OR** ondansetron (ZOFRAN) injection 4 mg, 4 mg, Intravenous, Q4H PRN, Nigel Bridgeman, CNM .  oxyCODONE-acetaminophen (PERCOCET/ROXICET) 5-325 MG per tablet 1 tablet, 1 tablet, Oral, Q4H PRN, Nigel Bridgeman, CNM, 1 tablet at 08/06/15 [phone] .  oxyCODONE-acetaminophen (PERCOCET/ROXICET) 5-325 MG per tablet 2 tablet, 2 tablet, Oral, Q4H PRN, Nigel Bridgeman, CNM .  prenatal multivitamin tablet 1 tablet, 1 tablet, Oral, Q1200, Nigel Bridgeman, CNM, 1 tablet at 08/05/15 1111 .  senna-docusate (Senokot-S) tablet 2  tablet, 2 tablet, Oral, Q24H, Nigel Bridgeman, CNM, 2 tablet at 08/06/15 0023 .  simethicone (MYLICON) chewable tablet 80 mg, 80 mg, Oral, PRN, Nigel Bridgeman, CNM .  Tdap (BOOSTRIX) injection 0.5 mL, 0.5 mL, Intramuscular, Once, Nigel Bridgeman, CNM, 0.5 mL at 08/05/15 1534 .  zolpidem (AMBIEN) tablet 5 mg, 5 mg, Oral, QHS PRN, Nigel Bridgeman, CNM After visit meds:    Medication List    STOP taking these medications        ondansetron 4 MG disintegrating tablet  Commonly known as:  ZOFRAN-ODT      TAKE these medications        calcium carbonate 500 MG chewable tablet  Commonly known as:  TUMS - dosed in mg elemental calcium  Chew 2 tablets by mouth 3 (three) times daily as needed for indigestion or heartburn.     cetirizine 10 MG tablet  Commonly known as:  ZYRTEC  Take 10 mg by mouth daily as needed for allergies.     ibuprofen 600 MG tablet  Commonly known as:  ADVIL,MOTRIN  Take 1 tablet (600 mg total) by mouth every 6 (six) hours as needed.     oxyCODONE-acetaminophen 5-325 MG tablet  Commonly known as:  PERCOCET/ROXICET  Take 1 tablet by mouth every 4 (four) hours as needed (for pain scale 4-7).     PNV PRENATAL PLUS MULTIVITAMIN PO  Take 1 tablet by mouth daily.        Diet: routine diet  Activity: Advance as tolerated. Pelvic rest for 6 weeks.   Outpatient follow [facility], ASAP, to scheduled outpatient Circ. Return in 5 wks for postpartum visit and the following week for contraception visit Follow up Appt:No future appointments. Follow up Visit:No Follow-up on file.  Postpartum contraception: Nexplanon  Newborn Data: Live born female  Birth Weight: 8 lb 8.2 oz (3860 g) APGAR: 9, 9  Baby Feeding: Breast Disposition:home with mother   08/06/2015 Alphonzo Severance, CNM

## 2015-08-06 NOTE — Discharge Instructions (Signed)
Postpartum Care After Vaginal Delivery °After you deliver your newborn (postpartum period), the usual stay in the hospital is 24-72 hours. If there were problems with your labor or delivery, or if you have other medical problems, you might be in the hospital longer.  °While you are in the hospital, you will receive help and instructions on how to care for yourself and your newborn during the postpartum period.  °While you are in the hospital: °· Be sure to tell your nurses if you have pain or discomfort, as well as where you feel the pain and what makes the pain worse. °· If you had an incision made near your vagina (episiotomy) or if you had some tearing during delivery, the nurses may put ice packs on your episiotomy or tear. The ice packs may help to reduce the pain and swelling. °· If you are breastfeeding, you may feel uncomfortable contractions of your uterus for a couple of weeks. This is normal. The contractions help your uterus get back to normal size. °· It is normal to have some bleeding after delivery. °· For the first 1-3 days after delivery, the flow is red and the amount may be similar to a period. °· It is common for the flow to start and stop. °· In the first few days, you may pass some small clots. Let your nurses know if you begin to pass large clots or your flow increases. °· Do not  flush blood clots down the toilet before having the nurse look at them. °· During the next 3-10 days after delivery, your flow should become more watery and pink or brown-tinged in color. °· Ten to fourteen days after delivery, your flow should be a small amount of yellowish-white discharge. °· The amount of your flow will decrease over the first few weeks after delivery. Your flow may stop in 6-8 weeks. Most women have had their flow stop by 12 weeks after delivery. °· You should change your sanitary pads frequently. °· Wash your hands thoroughly with soap and water for at least 20 seconds after changing pads, using  the toilet, or before holding or feeding your newborn. °· You should feel like you need to empty your bladder within the first 6-8 hours after delivery. °· In case you become weak, lightheaded, or faint, call your nurse before you get out of bed for the first time and before you take a shower for the first time. °· Within the first few days after delivery, your breasts may begin to feel tender and full. This is called engorgement. Breast tenderness usually goes away within 48-72 hours after engorgement occurs. You may also notice milk leaking from your breasts. If you are not breastfeeding, do not stimulate your breasts. Breast stimulation can make your breasts produce more milk. °· Spending as much time as possible with your newborn is very important. During this time, you and your newborn can feel close and get to know each other. Having your newborn stay in your room (rooming in) will help to strengthen the bond with your newborn.  It will give you time to get to know your newborn and become comfortable caring for your newborn. °· Your hormones change after delivery. Sometimes the hormone changes can temporarily cause you to feel sad or tearful. These feelings should not last more than a few days. If these feelings last longer than that, you should talk to your caregiver. °· If desired, talk to your caregiver about methods of family planning or contraception. °·   Talk to your caregiver about immunizations. Your caregiver may want you to have the following immunizations before leaving the hospital: °· Tetanus, diphtheria, and pertussis (Tdap) or tetanus and diphtheria (Td) immunization. It is very important that you and your family (including grandparents) or others caring for your newborn are up-to-date with the Tdap or Td immunizations. The Tdap or Td immunization can help protect your newborn from getting ill. °· Rubella immunization. °· Varicella (chickenpox) immunization. °· Influenza immunization. You should  receive this annual immunization if you did not receive the immunization during your pregnancy. °  °This information is not intended to replace advice given to you by your health care provider. Make sure you discuss any questions you have with your health care provider. °  °Document Released: 07/17/2007 Document Revised: 06/13/2012 Document Reviewed: 05/16/2012 °Elsevier Interactive Patient Education ©2016 [employer] ° °Iron-Rich Diet °Iron is a mineral that helps your body to produce hemoglobin. Hemoglobin is a protein in your red blood cells that carries oxygen to your body's tissues. Eating too little iron may cause you to feel weak and tired, and it can increase your risk for infection. Eating enough iron is necessary for your body's metabolism, muscle function, and nervous system. °Iron is naturally found in many foods. It can also be added to foods or fortified in foods. There are two types of dietary iron: °· Heme iron. Heme iron is absorbed by the body more easily than nonheme iron. Heme iron is found in meat, poultry, and fish. °· Nonheme iron. Nonheme iron is found in dietary supplements, iron-fortified grains, beans, and vegetables. °You may need to follow an iron-rich diet if: °· You have been diagnosed with iron deficiency or iron-deficiency anemia. °· You have a condition that prevents you from absorbing dietary iron, such as: °¨ Infection in your intestines. °¨ Celiac disease. This involves long-lasting (chronic) inflammation of your intestines. °· You do not eat enough iron. °· You eat a diet that is high in foods that impair iron absorption. °· You have lost a lot of blood. °· You have heavy bleeding during your menstrual cycle. °· You are pregnant. °WHAT IS MY PLAN? °Your health care provider may help you to determine how much iron you need per day based on your condition. Generally, when a person consumes sufficient amounts of iron in the diet, the following iron needs are met: °· Men. °¨ 14-18  years old: 11 mg per day. °¨ 19-50 years old: 8 mg per day. °· Women.   °¨ 14-18 years old: 15 mg per day. °¨ 19-50 years old: 18 mg per day. °¨ Over 50 years old: 8 mg per day. °¨ Pregnant women: 27 mg per day. °¨ Breastfeeding women: 9 mg per day. °WHAT DO I NEED TO KNOW ABOUT AN IRON-RICH DIET? °· Eat fresh fruits and vegetables that are high in vitamin C along with foods that are high in iron. This will help increase the amount of iron that your body absorbs from food, especially with foods containing nonheme iron. Foods that are high in vitamin C include oranges, peppers, tomatoes, and mango. °· Take iron supplements only as directed by your health care provider. Overdose of iron can be life-threatening. If you were prescribed iron supplements, take them with orange juice or a vitamin C supplement. °· Cook foods in pots and pans that are made from iron.   °· Eat nonheme iron-containing foods alongside foods that are high in heme iron. This helps to improve your iron absorption.   °·   Certain foods and drinks contain compounds that impair iron absorption. Avoid eating these foods in the same meal as iron-rich foods or with iron supplements. These include: °¨ Coffee, black tea, and red wine. °¨ Milk, dairy products, and foods that are high in calcium. °¨ Beans, soybeans, and peas. °¨ Whole grains. °· When eating foods that contain both nonheme iron and compounds that impair iron absorption, follow these tips to absorb iron better.   °¨ Soak beans overnight before cooking. °¨ Soak whole grains overnight and drain them before using. °¨ Ferment flours before baking, such as using yeast in bread dough. °WHAT FOODS CAN I EAT? °Grains  °Iron-fortified breakfast cereal. Iron-fortified whole-wheat bread. Enriched rice. Sprouted grains. °Vegetables  °Spinach. Potatoes with skin. Green peas. Broccoli. Red and green bell peppers. Fermented vegetables. °Fruits  °Prunes. Raisins. Oranges. Strawberries. Mango.  Grapefruit. °Meats and Other Protein Sources  °Beef liver. Oysters. Beef. Shrimp. Turkey. Chicken. Tuna. Sardines. Chickpeas. Nuts. Tofu. °Beverages  °Tomato juice. Fresh orange juice. Prune juice. Hibiscus tea. Fortified instant breakfast shakes. °Condiments  °Tahini. Fermented soy sauce.  °Sweets and Desserts  °Black-strap molasses.  °Other  °Wheat germ. °The items listed above may not be a complete list of recommended foods or beverages. Contact your dietitian for more options.  °WHAT FOODS ARE NOT RECOMMENDED? °Grains  °Whole grains. Bran cereal. Bran flour. Oats. °Vegetables  °Artichokes. Brussels sprouts. Kale. °Fruits  °Blueberries. Raspberries. Strawberries. Figs. °Meats and Other Protein Sources  °Soybeans. Products made from soy protein. °Dairy  °Milk. Cream. Cheese. Yogurt. Cottage cheese. °Beverages  °Coffee. Black tea. Red wine. °Sweets and Desserts  °Cocoa. Chocolate. Ice cream. °Other  °Basil. Oregano. Parsley. °The items listed above may not be a complete list of foods and beverages to avoid. Contact your dietitian for more information.  °  °This information is not intended to replace advice given to you by your health care provider. Make sure you discuss any questions you have with your health care provider. °  °Document Released: 05/03/2005 Document Revised: 10/10/2014 Document Reviewed: 04/16/2014 °Elsevier Interactive Patient Education ©2016 [employer] ° °

## 2018-05-02 LAB — OB RESULTS CONSOLE HIV ANTIBODY (ROUTINE TESTING): HIV: NONREACTIVE

## 2018-05-02 LAB — OB RESULTS CONSOLE RUBELLA ANTIBODY, IGM: Rubella: IMMUNE

## 2018-05-02 LAB — OB RESULTS CONSOLE HEPATITIS B SURFACE ANTIGEN: Hepatitis B Surface Ag: NEGATIVE

## 2018-05-02 LAB — OB RESULTS CONSOLE RPR: RPR: NONREACTIVE

## 2018-07-03 ENCOUNTER — Encounter (HOSPITAL_COMMUNITY): Payer: Self-pay | Admitting: *Deleted

## 2018-07-03 ENCOUNTER — Observation Stay (HOSPITAL_BASED_OUTPATIENT_CLINIC_OR_DEPARTMENT_OTHER): Payer: BLUE CROSS/BLUE SHIELD

## 2018-07-03 ENCOUNTER — Observation Stay (HOSPITAL_COMMUNITY)
Admission: AD | Admit: 2018-07-03 | Discharge: 2018-07-03 | Disposition: A | Discharge status: Home / Self Care | Payer: BLUE CROSS/BLUE SHIELD | Source: Ambulatory Visit | Attending: Obstetrics and Gynecology | Admitting: Obstetrics and Gynecology

## 2018-07-03 ENCOUNTER — Observation Stay (HOSPITAL_COMMUNITY): Payer: BLUE CROSS/BLUE SHIELD

## 2018-07-03 DIAGNOSIS — Z3A18 18 weeks gestation of pregnancy: Secondary | ICD-10-CM | POA: Insufficient documentation

## 2018-07-03 DIAGNOSIS — O42912 Preterm premature rupture of membranes, unspecified as to length of time between rupture and onset of labor, second trimester: Principal | ICD-10-CM | POA: Insufficient documentation

## 2018-07-03 DIAGNOSIS — Z3A17 17 weeks gestation of pregnancy: Secondary | ICD-10-CM

## 2018-07-03 DIAGNOSIS — Z363 Encounter for antenatal screening for malformations: Secondary | ICD-10-CM | POA: Diagnosis not present

## 2018-07-03 DIAGNOSIS — Z8759 Personal history of other complications of pregnancy, childbirth and the puerperium: Secondary | ICD-10-CM

## 2018-07-03 DIAGNOSIS — O4292 Full-term premature rupture of membranes, unspecified as to length of time between rupture and onset of labor: Secondary | ICD-10-CM | POA: Diagnosis not present

## 2018-07-03 HISTORY — DX: Headache: R51

## 2018-07-03 HISTORY — DX: Headache, unspecified: R51.9

## 2018-07-03 LAB — CBC
HCT: 33.7 % — ABNORMAL LOW (ref 36.0–46.0)
HEMOGLOBIN: 11.8 g/dL — AB (ref 12.0–15.0)
MCH: 31 pg (ref 26.0–34.0)
MCHC: 35 g/dL (ref 30.0–36.0)
MCV: 88.5 fL (ref 78.0–100.0)
PLATELETS: 320 10*3/uL (ref 150–400)
RBC: 3.81 MIL/uL — AB (ref 3.87–5.11)
RDW: 12.7 % (ref 11.5–15.5)
WBC: 10 10*3/uL (ref 4.0–10.5)

## 2018-07-03 LAB — TYPE AND SCREEN
ABO/RH(D): O POS
ANTIBODY SCREEN: NEGATIVE

## 2018-07-03 MED ORDER — ZOLPIDEM TARTRATE 5 MG PO TABS: 5.0000 mg | ORAL_TABLET | Freq: Every evening | ORAL | Status: DC | PRN

## 2018-07-03 MED ORDER — CALCIUM CARBONATE ANTACID 500 MG PO CHEW: 2.0000 | CHEWABLE_TABLET | ORAL | Status: DC | PRN

## 2018-07-03 MED ORDER — PRENATAL MULTIVITAMIN CH
1.0000 | ORAL_TABLET | Freq: Every day | ORAL | Status: DC
  Administered 2018-07-03: 1 via ORAL
  Filled 2018-07-03: qty 1

## 2018-07-03 MED ORDER — AZITHROMYCIN 1 G PO PACK
1.0000 g | PACK | Freq: Once | ORAL | Status: AC
  Administered 2018-07-03: 1 g via ORAL
  Filled 2018-07-03: qty 1

## 2018-07-03 MED ORDER — ACETAMINOPHEN 325 MG PO TABS: 650.0000 mg | ORAL_TABLET | ORAL | Status: DC | PRN

## 2018-07-03 MED ORDER — DOCUSATE SODIUM 100 MG PO CAPS
100.0000 mg | ORAL_CAPSULE | Freq: Every day | ORAL | Status: DC
  Administered 2018-07-03: 100 mg via ORAL
  Filled 2018-07-03: qty 1

## 2018-07-03 MED ORDER — AMOXICILLIN-POT CLAVULANATE 875-125 MG PO TABS
1.0000 | ORAL_TABLET | Freq: Two times a day (BID) | ORAL | Status: DC
  Administered 2018-07-03: 1 via ORAL
  Filled 2018-07-03: qty 1

## 2018-07-03 MED ORDER — AMOXICILLIN-POT CLAVULANATE 875-125 MG PO TABS: 1.0000 | ORAL_TABLET | Freq: Two times a day (BID) | ORAL | 0 refills | Status: DC

## 2018-07-03 NOTE — Discharge Summary (Signed)
Physician Discharge Summary  Patient ID: Alicia Singleton MRN: [id number] DOB/AGE: 1991-02-24 27 y.o.  Admit date: 07/03/2018 Discharge date: 07/03/2018  Admission Diagnoses:PPROM at 18 weeks  Discharge Diagnoses: Same Active Problems:   History of preterm premature rupture of membranes (PPROM)   Discharged Condition: good  Hospital Course: Pt admitted for [place] and MFM consult and abx.  US shows normal fluid and after consultation MFM rec outpatient management.  Cultures were done and oral abx started.  Pt desires d/c  Consults: MFM  Significant Diagnostic Studies: labs: hgb 11.  Normal WBC  Treatments: MFM consult, [place] and oral abx  Discharge Exam: Blood pressure 120/82, pulse 87, temperature 98 F (36.7 C), temperature source Oral, resp. rate 16, height 5\' 4"  (1.626 m), weight 77.1 kg, last menstrual period 02/28/2018, SpO2 100 %, unknown if currently breastfeeding. General appearance: alert, cooperative, appears stated age and no distress SSE vag pool, + nit, Cx closed  Disposition: Discharge disposition: [facility] Not Defined       Discharge Instructions    Call MD for:  difficulty breathing, headache or visual disturbances   Complete by:  As directed    Call MD for:  persistant nausea and vomiting   Complete by:  As directed    Call MD for:  redness, tenderness, or signs of infection (pain, swelling, redness, odor or green/yellow discharge around incision site)   Complete by:  As directed    Call MD for:  severe uncontrolled pain   Complete by:  As directed    Call MD for:  temperature >100.4   Complete by:  As directed    Diet general   Complete by:  As directed      Allergies as of 07/03/2018   No Known Allergies     Medication List    TAKE these medications   amoxicillin-clavulanate 875-125 MG tablet Commonly known as:  AUGMENTIN Take 1 tablet by mouth every 12 (twelve) hours.   cetirizine 10 MG tablet Commonly known as:   ZYRTEC Take 10 mg by mouth daily as needed for allergies.   cyclobenzaprine 5 MG tablet Commonly known as:  FLEXERIL Take 5 mg by mouth 3 (three) times daily as needed (For tension headache.).   prenatal multivitamin Tabs tablet Take 1 tablet by mouth at bedtime.   venlafaxine XR 75 MG 24 hr capsule Commonly known as:  EFFEXOR-XR Take 75 mg by mouth daily with breakfast.        Signed: Turner Retzloff 07/03/2018, 2:36 PM

## 2018-07-03 NOTE — MAU Note (Signed)
Pt told adm clerk, that her water was broke (17wks) and was a direct adm. 3rd floor and L&D unaware.

## 2018-07-03 NOTE — H&P (Signed)
Alicia Singleton is a 27 y.o. female presenting for PPROM with gush of fluid this am and continues to leak.  Was seen in the office with positive pool and nitrzine positive.  Cx was closed and she did not appear to be in labor.  Her pregnancy is complicated by 1st preg PPROM at 34 weeks and 2nd preg on Prog went to 40 weeks.  Had interval sterilization but apparently conceived right before BTL with this pregnancy.. OB History    Gravida  3   Para  2   Term  1   Preterm  1   AB  1   Living  2     SAB  1   TAB  0   Ectopic  0   Multiple  0   Live Births  2          Past Medical History:  Diagnosis Date  . Group B Streptococcus carrier, antepartum   . HSV infection   . Medical history non-contributory    Past Surgical History:  Procedure Laterality Date  . NO PAST SURGERIES     Family History: family history includes Diabetes in her father and other; Heart failure in her other; Hyperlipidemia in her father and other. Social History:  reports that she has never smoked. She does not have any smokeless tobacco history on file. She reports that she drinks alcohol. She reports that she does not use drugs.     Maternal Diabetes: No Genetic Screening: Normal Maternal Ultrasounds/Referrals: Normal Fetal Ultrasounds or other Referrals:  None Maternal Substance Abuse:  No Significant Maternal Medications:  None Significant Maternal Lab Results:  None Other Comments:  None  ROS History   unknown if currently breastfeeding. Exam Physical Exam  SSE Pos nitrazine, Pos pool.  Cx cl th high FHR 140s  Prenatal labs: ABO, Rh:   Antibody:   Rubella:   RPR:    HBsAg:    HIV:    GBS:     Assessment/Plan: PPROM at 39 6/7 weeks Plan 23 hour obs for MFM [place] and consult.  Will check GBS and then treat empirically with Augmentin and 1 gram zithromycin. May be considered for outpatient management with readmission at 23 weeks with NICU consult, IV abx and steroids at that  time if she desires to continue this pregnancy.   Turner Hinks 07/03/2018, 11:47 AM

## 2018-07-03 NOTE — Consult Note (Addendum)
Maternal-Fetal Medicine  Name: Alicia Singleton MRN: [id number]  Requesting Provider: Dr. Candice Camp, MD  I had the pleasure of seeing Alicia [facility]leton today at [facility]. She was accompanied by her partner and mother.  Patient is a G4 P1112 at 17w 5d gestation and was admitted with the diagnosis of PPROM. Patient gives history of leakage of amniotic fluid this morning when she felt a "gush" of fluid from the vagina. She does not have fever or abdominal pain or vaginal bleeding. On examination (Dr. Rana Snare) amnisure was positive and pooling of amniotic fluid was seen. She is afebrile. WBC is elevated (17.9).  Obstetric history:  -Early SAB (no D&C). -08/2010: Induction of labor at 34-weeks' gestation following PPROM. She delivered a female infant weighing 5-15 at birth. The newborn stayed in the NICU for 3 months (infection). He is in good health now. -08/2015: Term vaginal delivery of a female infant weighing 8-8 at birth. Different FOB (same partner who is the father of her baby in the current pregnancy).  Patient took prophylactic progesterone (17-OHP).  Prenatal: Patient is undecided about progesterone in this pregnancy.   Gyn history: History of abnormal Pap smear, but no cervical surgeries. She reports having had irregular periods. Patient had bilateral (laparoscopic) tubal sterilization on 03/29/18. Her LMP was 02/28/18. PMH: No history of any chronic medical conditions. PSH: Bilateral tubal sterilization. Medications: Prenatal vitamins, effexor. Allergies: NKDA. Social: Denies tobacco or drug or alcohol use. Her partner is [place] [employer] architect and he is in good health. Family: No history of venous thromboembolism in the family.  On ultrasound, amniotic fluid is normal and good fetal activity is seen. No markers of fetal aneuploidies or fetal structural defects are seen. Fetal biometry is consistent with her previously-established dates.  On transabdominal scan, the cervix  measures 3.9 cm, which is normal.  I counseled the patient on the following: Very early PPROM: -I explained ultrasound findings (normal anatomy and normal amniotic fluid). I discussed the possibility of "high leak" or evolving PPROM that takes a few days to manifest on ultrasound. Oligohydramnios may follow in a few days. Alternatively, high leakage of amniotic fluid may resolve (membrane resealing). -A majority of pregnant women with PPROM go into spontaneous labor over a variable period of time. -Complications of PPROM include maternal and fetal infections, cord prolapse, fetal death, placental abruption and limb contractures (reversible). -Most significantly, pulmonary hypoplasia can occur. I discussed lung development (16 to 24 weeks) and that for lung development a normal amniotic fluid is necessary. -Pulmonary hypoplasia cannot be predicted by prenatal ultrasound. -Patient chose expectant management. She can be discharged today and readmitted at 62 weeks' gestation when antenatal corticosteroids can be given. NICU consultation at 23 weeks is reasonable. -Neonatal prognosis is dependent on gestational age at delivery and the presence or absence of pulmonary hypoplasia. -Termination as an option (if severe oligohydramnios were to follow) was also briefly discussed. It was not recommended as the amniotic fluid was normal.   -The role of antibiotics in very early PPROM (before 24 weeks) is not clear. Patient may continue antibiotics for a week. -Patient was counseled specifically on infection and if she develops signs of maternal infection, we would recommend immediate delivery.  Recommendations: -Discharge home.  -Antibiotics may continue for a week. -Follow-up ultrasound in a week at your office to check amniotic fluid. If normal and no leakage is present, I recommend a follow-up ultrasound at the [facility] in 4 weeks for completion  of fetal anatomy. -HSG for tubal patency  after delivery.  Thank you for your consult. Please do not hesitate to contact me if you have any questions or concerns.  Consultation including face-to-face counseling: 40 min.

## 2018-07-04 LAB — GC/CHLAMYDIA PROBE AMP (~~LOC~~) NOT AT ARMC
CHLAMYDIA, DNA PROBE: NEGATIVE
NEISSERIA GONORRHEA: NEGATIVE

## 2018-07-05 LAB — CULTURE, BETA STREP (GROUP B ONLY)

## 2018-08-09 ENCOUNTER — Other Ambulatory Visit: Payer: Self-pay

## 2018-08-09 ENCOUNTER — Encounter (HOSPITAL_COMMUNITY): Payer: Self-pay

## 2018-08-09 ENCOUNTER — Inpatient Hospital Stay (HOSPITAL_COMMUNITY)
Admission: AD | Admit: 2018-08-09 | Discharge: 2018-08-10 | DRG: 833 | Disposition: A | Discharge status: Home / Self Care | Payer: BLUE CROSS/BLUE SHIELD | Attending: Obstetrics and Gynecology | Admitting: Obstetrics and Gynecology

## 2018-08-09 DIAGNOSIS — Z3A23 23 weeks gestation of pregnancy: Secondary | ICD-10-CM

## 2018-08-09 DIAGNOSIS — O42912 Preterm premature rupture of membranes, unspecified as to length of time between rupture and onset of labor, second trimester: Principal | ICD-10-CM | POA: Diagnosis present

## 2018-08-09 DIAGNOSIS — Z8759 Personal history of other complications of pregnancy, childbirth and the puerperium: Secondary | ICD-10-CM

## 2018-08-09 LAB — CBC
HCT: 34.2 % — ABNORMAL LOW (ref 36.0–46.0)
Hemoglobin: 11.7 g/dL — ABNORMAL LOW (ref 12.0–15.0)
MCH: 31.3 pg (ref 26.0–34.0)
MCHC: 34.2 g/dL (ref 30.0–36.0)
MCV: 91.4 fL (ref 80.0–100.0)
PLATELETS: 303 10*3/uL (ref 150–400)
RBC: 3.74 MIL/uL — ABNORMAL LOW (ref 3.87–5.11)
RDW: 12.9 % (ref 11.5–15.5)
WBC: 9.8 10*3/uL (ref 4.0–10.5)
nRBC: 0 % (ref 0.0–0.2)

## 2018-08-09 LAB — TYPE AND SCREEN
ABO/RH(D): O POS
Antibody Screen: NEGATIVE

## 2018-08-09 MED ORDER — DOCUSATE SODIUM 100 MG PO CAPS: 100.0000 mg | ORAL_CAPSULE | Freq: Every day | ORAL | Status: DC

## 2018-08-09 MED ORDER — ZOLPIDEM TARTRATE 5 MG PO TABS: 5.0000 mg | ORAL_TABLET | Freq: Every evening | ORAL | Status: DC | PRN

## 2018-08-09 MED ORDER — SODIUM CHLORIDE 0.9 % IV SOLN: 250.0000 mL | INTRAVENOUS | Status: DC | PRN

## 2018-08-09 MED ORDER — SODIUM CHLORIDE 0.9% FLUSH: 3.0000 mL | INTRAVENOUS | Status: DC | PRN

## 2018-08-09 MED ORDER — BETAMETHASONE SOD PHOS & ACET 6 (3-3) MG/ML IJ SUSP
12.0000 mg | INTRAMUSCULAR | Status: AC
  Administered 2018-08-09 – 2018-08-10 (×2): 12 mg via INTRAMUSCULAR
  Filled 2018-08-09 (×2): qty 2

## 2018-08-09 MED ORDER — SODIUM CHLORIDE 0.9% FLUSH
3.0000 mL | Freq: Two times a day (BID) | INTRAVENOUS | Status: DC
  Administered 2018-08-09 – 2018-08-10 (×2): 3 mL via INTRAVENOUS

## 2018-08-09 MED ORDER — CALCIUM CARBONATE ANTACID 500 MG PO CHEW: 2.0000 | CHEWABLE_TABLET | ORAL | Status: DC | PRN

## 2018-08-09 MED ORDER — ACETAMINOPHEN 325 MG PO TABS: 650.0000 mg | ORAL_TABLET | ORAL | Status: DC | PRN

## 2018-08-09 MED ORDER — PRENATAL MULTIVITAMIN CH
1.0000 | ORAL_TABLET | Freq: Every day | ORAL | Status: DC
  Administered 2018-08-09: 1 via ORAL
  Filled 2018-08-09: qty 1

## 2018-08-09 NOTE — H&P (Signed)
27 year old G 4 P 1112 at 23 weeks presents for admission. She experienced PPROM around 17 weeks and has been expectantly managed since then.  She is here today for admission/ steroids per Dr. Rana Snare  She denies fever, chills, abdominal pain. She denies leakage of fluid for past 24 hours  BP 115/75 (BP Location: Right Arm)   Pulse (!) 112   Temp 97.8 F (36.6 C) (Oral)   Resp 18   Ht 5\' 4"  (1.626 m)   Wt 76.7 kg   LMP 02/28/2018 (Exact Date)   SpO2 99%   BMI 29.01 kg/m   General alert and oriented Lung CTAB Car RRR Abdomen is soft and non tender uterus  IMPRESSION: IUP at 23 weeks PPROM since 17 weeks  PLAN: Admit  Steroids Will discuss with Dr. Rana Snare if he wants to initiate progesterone Last ultrasound on Tuesday and baby was vertex with normal fluid

## 2018-08-10 LAB — AMNISURE RUPTURE OF MEMBRANE (ROM) NOT AT ARMC: Amnisure ROM: NEGATIVE

## 2018-08-10 LAB — RPR: RPR Ser Ql: NONREACTIVE

## 2018-08-10 NOTE — Progress Notes (Signed)
Pt reports no leaking x 2 days.  Fluid has been clear and intermittent leaking since 17 weeks.  No vb or ctx.  Good FM  Af, VSS Gen - NAD Abd - gravid, fundus NT Ext - NT, no edema  A/P:  PPROM @ 17 wks Continue BMZ series - #2 today amniosure today - if +, will start latency abx and continue inpatient bedrest.         If neg, will consider d/c home after abx

## 2018-08-10 NOTE — Progress Notes (Signed)
Discharge teaching complete with pt. Pt understood all information and did not have any any questions. Pt discharged home

## 2018-08-10 NOTE — Plan of Care (Signed)
  Problem: Education: Goal: Knowledge of the prescribed therapeutic regimen will improve 08/10/2018 0513 by Bobbye Morton, RN Outcome: Progressing 08/10/2018 0513 by Bobbye Morton, RN Outcome: Progressing   Problem: Self-Concept: Goal: Communication of feelings regarding changes in body function or appearance will improve 08/10/2018 0513 by Bobbye Morton, RN Outcome: Progressing 08/10/2018 0513 by Bobbye Morton, RN Outcome: Progressing   Problem: Education: Goal: Understanding of sexual limitations or changes related to disease process or condition will improve Outcome: Progressing   Problem: Skin Integrity: Goal: Demonstration of wound healing without infection will improve 08/10/2018 0513 by Bobbye Morton, RN Outcome: Progressing 08/10/2018 0513 by Bobbye Morton, RN Outcome: Progressing

## 2018-08-10 NOTE — Progress Notes (Signed)
Amniosure negative No further leaking  Plan for discharge home and f/u in office monday

## 2018-08-10 NOTE — Plan of Care (Signed)
  Problem: Education: Goal: Knowledge of the prescribed therapeutic regimen will improve Outcome: Progressing   Problem: Self-Concept: Goal: Communication of feelings regarding changes in body function or appearance will improve Outcome: Progressing   Problem: Skin Integrity: Goal: Demonstration of wound healing without infection will improve Outcome: Progressing   Problem: Education: Goal: Knowledge of General Education information will improve Description Including pain rating scale, medication(s)/side effects and non-pharmacologic comfort measures Outcome: Progressing

## 2018-08-13 ENCOUNTER — Other Ambulatory Visit: Payer: Self-pay

## 2018-08-13 ENCOUNTER — Encounter (HOSPITAL_COMMUNITY): Payer: Self-pay

## 2018-08-13 ENCOUNTER — Inpatient Hospital Stay (HOSPITAL_COMMUNITY)
Admission: AD | Admit: 2018-08-13 | Discharge: 2018-08-13 | Disposition: A | Discharge status: Home / Self Care | Payer: BLUE CROSS/BLUE SHIELD | Source: Ambulatory Visit | Attending: Obstetrics and Gynecology | Admitting: Obstetrics and Gynecology

## 2018-08-13 DIAGNOSIS — O26892 Other specified pregnancy related conditions, second trimester: Secondary | ICD-10-CM

## 2018-08-13 DIAGNOSIS — N898 Other specified noninflammatory disorders of vagina: Secondary | ICD-10-CM | POA: Diagnosis not present

## 2018-08-13 DIAGNOSIS — Z3A23 23 weeks gestation of pregnancy: Secondary | ICD-10-CM | POA: Diagnosis not present

## 2018-08-13 DIAGNOSIS — O42912 Preterm premature rupture of membranes, unspecified as to length of time between rupture and onset of labor, second trimester: Secondary | ICD-10-CM | POA: Insufficient documentation

## 2018-08-13 DIAGNOSIS — Z3492 Encounter for supervision of normal pregnancy, unspecified, second trimester: Secondary | ICD-10-CM

## 2018-08-13 LAB — WET PREP, GENITAL
Clue Cells Wet Prep HPF POC: NONE SEEN
SPERM: NONE SEEN
TRICH WET PREP: NONE SEEN
YEAST WET PREP: NONE SEEN

## 2018-08-13 LAB — AMNISURE RUPTURE OF MEMBRANE (ROM) NOT AT ARMC: Amnisure ROM: NEGATIVE

## 2018-08-13 NOTE — MAU Note (Signed)
Urine in lab 

## 2018-08-13 NOTE — MAU Provider Note (Signed)
History     CSN: [id number]  Arrival date and time: 08/13/18 1648   First Provider Initiated Contact with Patient 08/13/18 1727      Chief Complaint  Patient presents with  . Rupture of Membranes  . Vaginal Discharge   HPI  Alicia Singleton is a 27 y.o. [id number] at [redacted]w[redacted]d who presents to MAU with chief complaint of leaking of fluid, new onset today at approximately 2pm.  Patient states she was grocery shopping with her mom at about 2pm today when she felt a "big glob of elmer's glue" in her underwear. States she also felt as if her leggings were wet.  Unable to state if she has continued to leak since that time.  Denies vaginal bleeding, gush of fluid, decreased fetal movement, fever, falls, or recent illness.    Patient's history is significant for PPROM at approximately 17 weeks. She is s/p BMZ 11/07 and 11/08. She was discharged after negative Amnisure yesterday.  OB History    Gravida  4   Para  2   Term  1   Preterm  1   AB  1   Living  2     SAB  1   TAB  0   Ectopic  0   Multiple  0   Live Births  2           Past Medical History:  Diagnosis Date  . Group B Streptococcus carrier, antepartum   . Headache    tension headaches 2019  . HSV infection     Past Surgical History:  Procedure Laterality Date  . TUBAL LIGATION      Family History  Problem Relation Age of Onset  . Hyperlipidemia Father   . Diabetes Father   . Diabetes Other   . Hyperlipidemia Other   . Heart failure Other     Social History   Tobacco Use  . Smoking status: Never Smoker  . Smokeless tobacco: Never Used  Substance Use Topics  . Alcohol use: Yes    Comment: social before preg  . Drug use: No    Allergies: No Known Allergies  Medications Prior to Admission  Medication Sig Dispense Refill Last Dose  . cetirizine (ZYRTEC) 10 MG tablet Take 10 mg by mouth daily as needed for allergies.   Past Month at Unknown time  . cyclobenzaprine (FLEXERIL) 5 MG tablet Take  5 mg by mouth 3 (three) times daily as needed (For tension headache.).   0 Past Month at Unknown time  . ondansetron (ZOFRAN-ODT) 4 MG disintegrating tablet Take 4 mg by mouth every 8 (eight) hours as needed for nausea.   1 Past Week at Unknown time  . Prenatal Vit-Fe Fumarate-FA (PRENATAL MULTIVITAMIN) TABS tablet Take 1 tablet by mouth at bedtime.   08/08/2018 at Unknown time  . venlafaxine XR (EFFEXOR-XR) 75 MG 24 hr capsule Take 75 mg by mouth daily with breakfast.   1 08/09/2018 at Unknown time    Review of Systems  Constitutional: Negative for chills, fatigue and fever.  Gastrointestinal: Negative for abdominal distention, abdominal pain, diarrhea, nausea and vomiting.  Genitourinary: Positive for vaginal discharge. Negative for difficulty urinating, dyspareunia, dysuria, vaginal bleeding and vaginal pain.  Musculoskeletal: Negative for back pain.  Neurological: Negative for dizziness.  All other systems reviewed and are negative.  Physical Exam   Blood pressure 128/85, pulse (!) 102, temperature 98.5 F (36.9 C), temperature source Oral, resp. rate 17, weight 76.5 kg, last menstrual period  02/28/2018, SpO2 100 %, unknown if currently breastfeeding.  Physical Exam  Nursing note and vitals reviewed. Constitutional: She is oriented to person, place, and time. She appears well-developed and well-nourished.  Cardiovascular: Normal rate, normal heart sounds and intact distal pulses.  GI:  Gravid  Genitourinary:  Genitourinary Comments: Cervix visually closed on SSE Large white clump of vaginal discharge visualized on exam. Not foul-smelling, no new discharge after removal of clump with fox swab x 1  Neurological: She is alert and oriented to person, place, and time.  Skin: Skin is warm and dry.  Psychiatric: She has a normal mood and affect. Her behavior is normal. Judgment and thought content normal.    MAU Course  Procedures  MDM  Negative pooling Negative Fern Negative  Amnisure No new leaking of fluid during management in MAU Reassuring fetal tracing: baseline 145, moderate variability, positive 10 x 10 accelerations, no decelerations Toco: quiet  Patient Vitals for the past 24 hrs:  BP Temp Temp src Pulse Resp SpO2 Weight  08/13/18 1710 128/85 98.5 F (36.9 C) Oral (!) 102 17 100 % 76.5 kg    Results for orders placed or performed during the hospital encounter of 08/13/18 (from the past 24 hour(s))  Amnisure rupture of membrane (rom)not at [facility]     Status: None   Collection Time: 08/13/18  5:43 PM  Result Value Ref Range   Amnisure ROM NEGATIVE   Wet prep, genital     Status: Abnormal   Collection Time: 08/13/18  5:44 PM  Result Value Ref Range   Yeast Wet Prep HPF POC NONE SEEN NONE SEEN   Trich, Wet Prep NONE SEEN NONE SEEN   Clue Cells Wet Prep HPF POC NONE SEEN NONE SEEN   WBC, Wet Prep HPF POC MANY (A) NONE SEEN   Sperm NONE SEEN     Assessment and Plan  --28 y.o. [id number] at [redacted]w[redacted]d s/p inpatient evaluation for PPROM  --s/p BMZ 11/07 and 11/08 --Intact amniotic membranes --Reactive fetal tracing --Discharge home in stable condition  F/U: Next OB appt Wednesday 08/15/2018  Calvert Cantor, CNM 08/13/2018, 6:33 PM

## 2018-08-13 NOTE — Discharge Instructions (Signed)
Second Trimester of Pregnancy The second trimester is from week 13 through week 28, month 4 through 6. This is often the time in pregnancy that you feel your best. Often times, morning sickness has lessened or quit. You may have more energy, and you may get hungry more often. Your unborn baby (fetus) is growing rapidly. At the end of the sixth month, he or she is about 9 inches long and weighs about 1 pounds. You will likely feel the baby move (quickening) between 18 and 20 weeks of pregnancy. Follow these instructions at home:  Avoid all smoking, herbs, and alcohol. Avoid drugs not approved by your doctor.  Do not use any tobacco products, including cigarettes, chewing tobacco, and electronic cigarettes. If you need help quitting, ask your doctor. You may get counseling or other support to help you quit.  Only take medicine as told by your doctor. Some medicines are safe and some are not during pregnancy.  Exercise only as told by your doctor. Stop exercising if you start having cramps.  Eat regular, healthy meals.  Wear a good support bra if your breasts are tender.  Do not use hot tubs, steam rooms, or saunas.  Wear your seat belt when driving.  Avoid raw meat, uncooked cheese, and liter boxes and soil used by cats.  Take your prenatal vitamins.  Take 1500-2000 milligrams of calcium daily starting at the 20th week of pregnancy until you deliver your baby.  Try taking medicine that helps you poop (stool softener) as needed, and if your doctor approves. Eat more fiber by eating fresh fruit, vegetables, and whole grains. Drink enough fluids to keep your pee (urine) clear or pale yellow.  Take warm water baths (sitz baths) to soothe pain or discomfort caused by hemorrhoids. Use hemorrhoid cream if your doctor approves.  If you have puffy, bulging veins (varicose veins), wear support hose. Raise (elevate) your feet for 15 minutes, 3-4 times a day. Limit salt in your diet.  Avoid heavy  lifting, wear low heals, and sit up straight.  Rest with your legs raised if you have leg cramps or low back pain.  Visit your dentist if you have not gone during your pregnancy. Use a soft toothbrush to brush your teeth. Be gentle when you floss.  You can have sex (intercourse) unless your doctor tells you not to.  Go to your doctor visits. Get help if:  You feel dizzy.  You have mild cramps or pressure in your lower belly (abdomen).  You have a nagging pain in your belly area.  You continue to feel sick to your stomach (nauseous), throw up (vomit), or have watery poop (diarrhea).  You have bad smelling fluid coming from your vagina.  You have pain with peeing (urination). Get help right away if:  You have a fever.  You are leaking fluid from your vagina.  You have spotting or bleeding from your vagina.  You have severe belly cramping or pain.  You lose or gain weight rapidly.  You have trouble catching your breath and have chest pain.  You notice sudden or extreme puffiness (swelling) of your face, hands, ankles, feet, or legs.  You have not felt the baby move in over an hour.  You have severe headaches that do not go away with medicine.  You have vision changes. This information is not intended to replace advice given to you by your health care provider. Make sure you discuss any questions you have with your health care   provider. Document Released: 12/14/2009 Document Revised: 02/25/2016 Document Reviewed: 11/20/2012 Elsevier Interactive Patient Education  2017 [employer]  

## 2018-08-13 NOTE — MAU Note (Signed)
Ruptured at 17wk.  Is 23wks now.  ? Leaking, was at office, they wanted her to come here.  Had some weird d/c had some pure white thick d/c. On [place] today, fluid level had gone up.  Was in the hosp for a wk, when rechecked test was neg.

## 2018-08-28 NOTE — Discharge Summary (Signed)
Physician Discharge Summary  Patient ID: Alicia ButteryKasey L Ramser MRN: [id number] DOB/AGE: 27/01/1991 27 y.o.  Admit date: 08/09/2018 Discharge date: 08/28/2018  Admission Diagnoses:  Leaking amniotic fluid  Discharge Diagnoses:  Active Problems:   History of preterm premature rupture of membranes (PPROM)   Discharged Condition: stable  Hospital Course: pt was admitted for BMZ series b/c of a previous h/o PPROM.  Pt noted that fluid no longer leaking and amnisure was done after received course of steroids.  Amnisure negative and pt d/c home with close f'u and precautions  Consults: None  Significant Diagnostic Studies: amnisure   Discharge Exam: Blood pressure 116/72, pulse 95, temperature 98 F (36.7 C), temperature source Oral, resp. rate 16, height 5\' 4"  (1.626 m), weight 76.1 kg, last menstrual period 02/28/2018, SpO2 99 %, unknown if currently breastfeeding. Gen - NAD Abd - gravid, NT fundus NT Ext - NT Disposition: Discharge disposition: 01-Home or Self Care        Allergies as of 08/10/2018   No Known Allergies     Medication List    STOP taking these medications   amoxicillin-clavulanate 875-125 MG tablet Commonly known as:  AUGMENTIN     TAKE these medications   cetirizine 10 MG tablet Commonly known as:  ZYRTEC Take 10 mg by mouth daily as needed for allergies.   cyclobenzaprine 5 MG tablet Commonly known as:  FLEXERIL Take 5 mg by mouth 3 (three) times daily as needed (For tension headache.).   ondansetron 4 MG disintegrating tablet Commonly known as:  ZOFRAN-ODT Take 4 mg by mouth every 8 (eight) hours as needed for nausea.   prenatal multivitamin Tabs tablet Take 1 tablet by mouth at bedtime.   venlafaxine XR 75 MG 24 hr capsule Commonly known as:  EFFEXOR-XR Take 75 mg by mouth daily with breakfast.        Signed: Zelphia CairoGretchen Asianna Brundage 08/28/2018, 10:00 AM

## 2018-09-27 ENCOUNTER — Other Ambulatory Visit: Payer: Self-pay

## 2018-09-27 ENCOUNTER — Encounter (HOSPITAL_COMMUNITY): Payer: Self-pay

## 2018-09-27 ENCOUNTER — Inpatient Hospital Stay (HOSPITAL_COMMUNITY)
Admission: AD | Admit: 2018-09-27 | Discharge: 2018-10-28 | DRG: 806 | Disposition: A | Discharge status: Home / Self Care | Payer: BLUE CROSS/BLUE SHIELD | Attending: Obstetrics & Gynecology | Admitting: Obstetrics & Gynecology

## 2018-09-27 ENCOUNTER — Inpatient Hospital Stay (HOSPITAL_BASED_OUTPATIENT_CLINIC_OR_DEPARTMENT_OTHER): Payer: BLUE CROSS/BLUE SHIELD

## 2018-09-27 DIAGNOSIS — Z3A30 30 weeks gestation of pregnancy: Secondary | ICD-10-CM

## 2018-09-27 DIAGNOSIS — Z3A33 33 weeks gestation of pregnancy: Secondary | ICD-10-CM | POA: Diagnosis not present

## 2018-09-27 DIAGNOSIS — O4290 Premature rupture of membranes, unspecified as to length of time between rupture and onset of labor, unspecified weeks of gestation: Secondary | ICD-10-CM | POA: Diagnosis not present

## 2018-09-27 DIAGNOSIS — Z8759 Personal history of other complications of pregnancy, childbirth and the puerperium: Secondary | ICD-10-CM

## 2018-09-27 DIAGNOSIS — O26893 Other specified pregnancy related conditions, third trimester: Secondary | ICD-10-CM

## 2018-09-27 DIAGNOSIS — L739 Follicular disorder, unspecified: Secondary | ICD-10-CM | POA: Diagnosis present

## 2018-09-27 DIAGNOSIS — O322XX0 Maternal care for transverse and oblique lie, not applicable or unspecified: Secondary | ICD-10-CM | POA: Diagnosis present

## 2018-09-27 DIAGNOSIS — A6000 Herpesviral infection of urogenital system, unspecified: Secondary | ICD-10-CM | POA: Diagnosis present

## 2018-09-27 DIAGNOSIS — O9832 Other infections with a predominantly sexual mode of transmission complicating childbirth: Secondary | ICD-10-CM | POA: Diagnosis present

## 2018-09-27 DIAGNOSIS — Z3A31 31 weeks gestation of pregnancy: Secondary | ICD-10-CM | POA: Diagnosis not present

## 2018-09-27 DIAGNOSIS — O289 Unspecified abnormal findings on antenatal screening of mother: Secondary | ICD-10-CM | POA: Diagnosis not present

## 2018-09-27 DIAGNOSIS — O09213 Supervision of pregnancy with history of pre-term labor, third trimester: Secondary | ICD-10-CM | POA: Diagnosis not present

## 2018-09-27 DIAGNOSIS — Z3A32 32 weeks gestation of pregnancy: Secondary | ICD-10-CM | POA: Diagnosis not present

## 2018-09-27 DIAGNOSIS — O42913 Preterm premature rupture of membranes, unspecified as to length of time between rupture and onset of labor, third trimester: Secondary | ICD-10-CM | POA: Diagnosis present

## 2018-09-27 DIAGNOSIS — O42919 Preterm premature rupture of membranes, unspecified as to length of time between rupture and onset of labor, unspecified trimester: Secondary | ICD-10-CM

## 2018-09-27 DIAGNOSIS — Z362 Encounter for other antenatal screening follow-up: Secondary | ICD-10-CM | POA: Diagnosis not present

## 2018-09-27 DIAGNOSIS — N898 Other specified noninflammatory disorders of vagina: Secondary | ICD-10-CM

## 2018-09-27 LAB — CBC
HCT: 33.9 % — ABNORMAL LOW (ref 36.0–46.0)
HEMOGLOBIN: 11.3 g/dL — AB (ref 12.0–15.0)
MCH: 30.3 pg (ref 26.0–34.0)
MCHC: 33.3 g/dL (ref 30.0–36.0)
MCV: 90.9 fL (ref 80.0–100.0)
Platelets: 242 10*3/uL (ref 150–400)
RBC: 3.73 MIL/uL — ABNORMAL LOW (ref 3.87–5.11)
RDW: 12.8 % (ref 11.5–15.5)
WBC: 5.8 10*3/uL (ref 4.0–10.5)
nRBC: 0 % (ref 0.0–0.2)

## 2018-09-27 LAB — TYPE AND SCREEN
ABO/RH(D): O POS
Antibody Screen: NEGATIVE

## 2018-09-27 LAB — OB RESULTS CONSOLE GBS
GBS: NEGATIVE
GBS: NEGATIVE
STREP GROUP B AG: NEGATIVE

## 2018-09-27 MED ORDER — CALCIUM CARBONATE ANTACID 500 MG PO CHEW: 2.0000 | CHEWABLE_TABLET | ORAL | Status: DC | PRN

## 2018-09-27 MED ORDER — BETAMETHASONE SOD PHOS & ACET 6 (3-3) MG/ML IJ SUSP
12.0000 mg | INTRAMUSCULAR | Status: AC
  Administered 2018-09-27 – 2018-09-28 (×2): 12 mg via INTRAMUSCULAR
  Filled 2018-09-27 (×2): qty 2

## 2018-09-27 MED ORDER — AMOXICILLIN 500 MG PO CAPS
500.0000 mg | ORAL_CAPSULE | Freq: Three times a day (TID) | ORAL | Status: AC
  Administered 2018-09-29 – 2018-10-04 (×15): 500 mg via ORAL
  Filled 2018-09-27 (×15): qty 1

## 2018-09-27 MED ORDER — SODIUM CHLORIDE 0.9 % IV SOLN
500.0000 mg | INTRAVENOUS | Status: AC
  Administered 2018-09-27 – 2018-09-28 (×2): 500 mg via INTRAVENOUS
  Filled 2018-09-27 (×2): qty 500

## 2018-09-27 MED ORDER — DOCUSATE SODIUM 100 MG PO CAPS
100.0000 mg | ORAL_CAPSULE | Freq: Every day | ORAL | Status: DC
  Administered 2018-09-28 – 2018-10-24 (×27): 100 mg via ORAL
  Filled 2018-09-27 (×27): qty 1

## 2018-09-27 MED ORDER — ZOLPIDEM TARTRATE 5 MG PO TABS
5.0000 mg | ORAL_TABLET | Freq: Every evening | ORAL | Status: DC | PRN
  Administered 2018-09-28 – 2018-10-23 (×25): 5 mg via ORAL
  Filled 2018-09-27 (×25): qty 1

## 2018-09-27 MED ORDER — SODIUM CHLORIDE 0.9% FLUSH
3.0000 mL | INTRAVENOUS | Status: DC | PRN
  Administered 2018-10-02: 3 mL via INTRAVENOUS
  Filled 2018-09-27: qty 3

## 2018-09-27 MED ORDER — SODIUM CHLORIDE 0.9% FLUSH
3.0000 mL | Freq: Two times a day (BID) | INTRAVENOUS | Status: DC
  Administered 2018-09-27 – 2018-09-29 (×4): 3 mL via INTRAVENOUS

## 2018-09-27 MED ORDER — PRENATAL MULTIVITAMIN CH
1.0000 | ORAL_TABLET | Freq: Every day | ORAL | Status: DC
  Administered 2018-09-28 – 2018-10-24 (×26): 1 via ORAL
  Filled 2018-09-27 (×26): qty 1

## 2018-09-27 MED ORDER — AZITHROMYCIN 250 MG PO TABS
500.0000 mg | ORAL_TABLET | Freq: Every day | ORAL | Status: AC
  Administered 2018-09-29 – 2018-10-03 (×5): 500 mg via ORAL
  Filled 2018-09-27 (×5): qty 2

## 2018-09-27 MED ORDER — SODIUM CHLORIDE 0.9 % IV SOLN
2.0000 g | Freq: Four times a day (QID) | INTRAVENOUS | Status: AC
  Administered 2018-09-27 – 2018-09-29 (×8): 2 g via INTRAVENOUS
  Filled 2018-09-27 (×6): qty 2
  Filled 2018-09-27: qty 2000
  Filled 2018-09-27: qty 2
  Filled 2018-09-27: qty 2000

## 2018-09-27 MED ORDER — SODIUM CHLORIDE 0.9 % IV SOLN
250.0000 mL | INTRAVENOUS | Status: DC | PRN
  Administered 2018-09-27 – 2018-09-28 (×3): 250 mL via INTRAVENOUS

## 2018-09-27 MED ORDER — ACETAMINOPHEN 325 MG PO TABS
650.0000 mg | ORAL_TABLET | ORAL | Status: DC | PRN
  Administered 2018-10-02 – 2018-10-15 (×6): 650 mg via ORAL
  Filled 2018-09-27 (×6): qty 2

## 2018-09-27 NOTE — H&P (Signed)
Alicia Singleton is a 27 y.o. female [phone] @ 30wks presenting for admission.  Pt with PPROM @ 17 wks.  She was admitted at 23 wks for BMZ - during this admission, she stopped LOF and rpt anmisure was negative.  She has been followed closely with weekly FHT, CBC and AFI.  She is now leaking fluid again (clear) and desires inpatient mngt.  She denies VB and ctx.  Reports good FM.  She has a h/o PPROM with previous pregnancy and PTD.   OB History    Gravida  4   Para  2   Term  1   Preterm  1   AB  1   Living  2     SAB  1   TAB  0   Ectopic  0   Multiple  0   Live Births  2          Past Medical History:  Diagnosis Date  . Group B Streptococcus carrier, antepartum   . Headache    tension headaches 2019  . HSV infection    Past Surgical History:  Procedure Laterality Date  . TUBAL LIGATION     Family History: family history includes Diabetes in her father and another family member; Heart failure in an other family member; Hyperlipidemia in her father and another family member. Social History:  reports that she has never smoked. She has never used smokeless tobacco. She reports current alcohol use. She reports that she does not use drugs.     Maternal Diabetes: No Genetic Screening: Normal Maternal Ultrasounds/Referrals: Normal Fetal Ultrasounds or other Referrals:  None Maternal Substance Abuse:  No Significant Maternal Medications:  None Significant Maternal Lab Results:  None Other Comments:  None  ROS History   Blood pressure 99/72, pulse (!) 102, temperature 98.2 F (36.8 C), temperature source Oral, resp. rate 18, height 5\' 4"  (1.626 m), last menstrual period 02/28/2018, SpO2 98 %, unknown if currently breastfeeding. Exam Physical Exam  Gen - NAD Abd - gravid, NT Ext - NT, no edema  Prenatal labs: ABO, Rh: --/--/O POS (11/07 [id number]) Antibody: NEG (11/07 0953) Rubella:   RPR: Non Reactive (11/07 0953)  HBsAg:    HIV:    GBS:      Assessment/Plan: Admit Rpt steroids (last given at 23 wks) PPROM abx series Plan inpatient mngt and delivery at 34 wks    Alicia Singleton 09/27/2018, 3:34 PM

## 2018-09-28 LAB — RPR: RPR Ser Ql: NONREACTIVE

## 2018-09-28 MED ORDER — ONDANSETRON HCL 4 MG/2ML IJ SOLN: 4.0000 mg | Freq: Four times a day (QID) | INTRAMUSCULAR | Status: DC

## 2018-09-28 MED ORDER — ONDANSETRON HCL 4 MG/2ML IJ SOLN
4.0000 mg | Freq: Four times a day (QID) | INTRAMUSCULAR | Status: DC | PRN
  Administered 2018-09-28: 4 mg via INTRAVENOUS
  Filled 2018-09-28: qty 2

## 2018-09-28 MED ORDER — OSELTAMIVIR PHOSPHATE 75 MG PO CAPS
75.0000 mg | ORAL_CAPSULE | Freq: Two times a day (BID) | ORAL | Status: AC
  Administered 2018-09-28 – 2018-10-02 (×10): 75 mg via ORAL
  Filled 2018-09-28 (×10): qty 1

## 2018-09-28 MED ORDER — VENLAFAXINE HCL ER 150 MG PO CP24
150.0000 mg | ORAL_CAPSULE | Freq: Every day | ORAL | Status: DC
  Administered 2018-09-28 – 2018-10-24 (×27): 150 mg via ORAL
  Filled 2018-09-28 (×29): qty 1

## 2018-09-28 NOTE — Progress Notes (Signed)
[redacted]w[redacted]d  S// still leaking sodme clear AF, good FM  O//BP (!) 76/49 (BP Location: Right Arm)   Pulse 83   Temp 97.7 F (36.5 C) (Oral)   Resp 16   Ht 5\' 4"  (1.626 m)   Wt 77.6 kg   LMP 02/28/2018 (Exact Date)   SpO2 99%   BMI 29.35 kg/m  CBC    Component Value Date/Time   WBC 5.8 09/27/2018 1547   RBC 3.73 (L) 09/27/2018 1547   HGB 11.3 (L) 09/27/2018 1547   HGB 11.7 05/07/2015   HCT 33.9 (L) 09/27/2018 1547   HCT 34 05/07/2015   PLT 242 09/27/2018 1547   PLT 354 12/16/2014   MCV 90.9 09/27/2018 1547   MCH 30.3 09/27/2018 1547   MCHC 33.3 09/27/2018 1547   RDW 12.8 09/27/2018 1547   LYMPHSABS 2.6 06/22/2010 0035   MONOABS 0.8 06/22/2010 0035   EOSABS 0.2 06/22/2010 0035   BASOSABS 0.0 06/22/2010 0035    Prelim US yest>>>transvese lie  A+P//798w1d PPROM, In process of BMZ Cont ABX Plan del @ 34 Discussed CS for persist trans lie

## 2018-09-29 LAB — CULTURE, BETA STREP (GROUP B ONLY)

## 2018-09-29 NOTE — Progress Notes (Signed)
823w2d  S// no C/O  O//BP (!) 91/59 (BP Location: Right Arm)   Pulse 89   Temp 98.4 F (36.9 C) (Oral)   Resp 16   Ht 5\' 4"  (1.626 m)   Wt 77.6 kg   LMP 02/28/2018 (Exact Date)   SpO2 98%   BMI 29.35 kg/m   Fundus soft, FHR 132  MFM rec BPP 2X/week  A+P// 133w2d PPROM, S/p BMZ +ABX protocol Tamiflu for FLU exposure in her family

## 2018-09-30 MED ORDER — ONDANSETRON HCL 8 MG PO TABS
8.0000 mg | ORAL_TABLET | Freq: Three times a day (TID) | ORAL | Status: DC | PRN
  Administered 2018-09-30 – 2018-10-18 (×3): 8 mg via ORAL
  Filled 2018-09-30: qty 2
  Filled 2018-09-30: qty 1
  Filled 2018-09-30 (×2): qty 2

## 2018-09-30 NOTE — Progress Notes (Signed)
7264w3d   S/  No changes, good FM  O/  BP 104/63 (BP Location: Right Arm)   Pulse 82   Temp 98.5 F (36.9 C) (Oral)   Resp 16   Ht 5\' 4"  (1.626 m)   Wt 77.6 kg   LMP 02/28/2018 (Exact Date)   SpO2 100%   BMI 29.35 kg/m   Fundus soft, FHR 136  A+P// 6764w3d  PPROM For BPP 2x/wk Trans lie

## 2018-10-01 ENCOUNTER — Inpatient Hospital Stay (HOSPITAL_BASED_OUTPATIENT_CLINIC_OR_DEPARTMENT_OTHER): Payer: BLUE CROSS/BLUE SHIELD

## 2018-10-01 DIAGNOSIS — O4290 Premature rupture of membranes, unspecified as to length of time between rupture and onset of labor, unspecified weeks of gestation: Secondary | ICD-10-CM

## 2018-10-01 DIAGNOSIS — Z3A30 30 weeks gestation of pregnancy: Secondary | ICD-10-CM

## 2018-10-01 DIAGNOSIS — O09213 Supervision of pregnancy with history of pre-term labor, third trimester: Secondary | ICD-10-CM

## 2018-10-01 NOTE — Progress Notes (Signed)
S: No c/o - denies vaginal bleeding, contractions, abdominal pain, and subjective fever. Occasionally leaks clear fluid.   O: AFVSS Gen: well appearing, NAD Pulm: NWOB Abd: soft, nontender, gravid No LOF, small razor bump on L labia major, no evidence of HSV outbreak  A/P: 40JW [age] G4P1112 @ 30.4 wga with PPROM @ 6217 wga admitted for inpatient management   # PPROM - risks/benefits of expectant management previously discussed - hx PPROM/PTB - declined 17OHP - Latency abx - Delivery by 34 wga or sooner prn  # MWB - H/o genital HSV - understands if outbreak around time of delivery would require CS. Understands risks of PPROM with known HSV. Last outbreak >5 years ago. On exam today, no outbreaks  But small folliculitis s/s razor. Rec warm compress. - s/p BTL  # FWB:  - Daily NSTs - Mon/Thurs BPP - s/p BMZ 11/7/-8 and 12/26-12/27  # ROD:  - TRANSVERSE lie, would be for CS if position is unchanged - pt understands risks.   # Dispo: inpatient   Belva AgeeElise Deshunda Thackston MD

## 2018-10-02 LAB — TYPE AND SCREEN
ABO/RH(D): O POS
Antibody Screen: NEGATIVE

## 2018-10-02 NOTE — Progress Notes (Signed)
S: No complaints.   O: AFVSS Gen: well appearing, NAD Pulm: NWOB Abd: soft, nontender, gravid No LOF, razor bump draining  A/P: 27yo [id number] @ 30.5 wga with PPROM @ 17 wga admitted for inpatient management   # PPROM - risks/benefits of expectant management previously discussed - hx PPROM/PTB - declined 17OHP - Latency abx - Delivery by 34 wga or sooner prn - may consider extending to 36 wga but more likely 34 wga  # MWB - H/o genital HSV - understands if outbreak around time of delivery would require CS. Understands risks of PPROM with known HSV. Last outbreak >5 years ago. On exam today, no outbreaks. Folliculitis improved from razor bump - s/p BTL  # FWB:  - Daily NSTs - Mon/Thurs BPP - Monday BPP 8/8 with normal fluid - s/p BMZ 11/7/-8 and 12/26-12/27  # ROD:  - TRANSVERSE lie, would be for CS if position is unchanged - pt understands risks.   # Dispo: inpatient   Belva AgeeElise Roselynn Whitacre MD

## 2018-10-03 NOTE — Progress Notes (Signed)
S:No complaints. Denies ctxn and VB. +FM. Scant LOF.   O:AFVSS Gen: well appearing, NAD Pulm: NWOB Abd: soft, nontender, gravid No LOF, razor bump area dec sig, almost resolved  A/P:27yo [id number]@ 30.6 wgawith PPROM @ 17 wga admitted for inpatient management   # PPROM - risks/benefits of expectant management previously discussed - hx PPROM/PTB - declined 17OHP - Latency abx - Delivery by 34 wga or sooner prn - may consider extending to 36 wga but more likely 34 wga  # MWB - H/o genital HSV - understands if outbreak around time of delivery would require CS. Understands risks of PPROM with known HSV. Last outbreak >5 years ago. On exam today, no outbreaks. Folliculitis improved from razor bump and almost resolved - s/p BTL  # FWB:  - Daily NSTs - Mon/Thurs BPP - Monday BPP 8/8 with normal fluid - s/p BMZ 11/7/-8 and 12/26-12/27  # ROD:  - TRANSVERSE lie, would be for CS if position is unchanged - pt understands risks.  # Dispo: inpatient   Belva Agee MD

## 2018-10-03 NOTE — L&D Delivery Note (Addendum)
Delivery Note At 12:58 AM a viable female was delivered via Vaginal, Spontaneous (Presentation: ROA).  APGAR: 8, 10; weight pending.   Placenta status: S, I and sent to pathology. 3V Cord with the following complications: loose nuchal; delivered through.  Cord pH: n/a  Anesthesia:  CLEA Episiotomy: None Lacerations: None Suture Repair: n/a Est. Blood Loss (mL): 206  Mom to postpartum.  Baby to NICU.  Alicia Singleton 10/26/2018, 1:12 AM

## 2018-10-04 ENCOUNTER — Inpatient Hospital Stay (HOSPITAL_BASED_OUTPATIENT_CLINIC_OR_DEPARTMENT_OTHER): Payer: BLUE CROSS/BLUE SHIELD

## 2018-10-04 DIAGNOSIS — Z3A31 31 weeks gestation of pregnancy: Secondary | ICD-10-CM

## 2018-10-04 DIAGNOSIS — O4290 Premature rupture of membranes, unspecified as to length of time between rupture and onset of labor, unspecified weeks of gestation: Secondary | ICD-10-CM | POA: Diagnosis not present

## 2018-10-04 DIAGNOSIS — O09213 Supervision of pregnancy with history of pre-term labor, third trimester: Secondary | ICD-10-CM

## 2018-10-04 MED ORDER — FLUCONAZOLE 150 MG PO TABS
150.0000 mg | ORAL_TABLET | Freq: Once | ORAL | Status: AC
  Administered 2018-10-04: 150 mg via ORAL
  Filled 2018-10-04: qty 1

## 2018-10-04 NOTE — Progress Notes (Signed)
31 0/7  Small leak daily, no gush, no blood  VSS Afeb  Abdomen soft, NT  NST yesterday reactive  BPP today- presentation "variable" , AFI normal                     BPP 6/8 ( no breathing)  A/P:   PPROM-Latency ATB                  Continue expectant management  MWB-H/O genital HSV, last outbreak years ago           "razor bump" resolving  FWB-NST reactive yesterday           BPP 6/8           NST today-if reactive>BPP Monday, if not reactive>repeat BPP  ROD-variable presentation. D/W patient C/S if not vtx

## 2018-10-05 NOTE — Progress Notes (Signed)
Patient ID: Alicia Singleton, female   DOB: 1990/10/23, 28 y.o.   MRN: [id number] Pt without complaints. GFM continues to leak clear fluid  VSSAF FHR 150s cat 1 Rare ctxs Reactive NST yesterday and BPP 6/8  Abd Gravid nt LE:  Neg homans bilaterally  PPROM at 3 1/7 S/P latency Abx and BMZ series Hx of HSV - cont Valtrex - no active outbreaks at this time BPP twice weekly - reasurring C/S for delivery due to Transverse presentatiion.  At 34 weeks

## 2018-10-05 NOTE — Progress Notes (Signed)
Initial Nutrition Assessment  DOCUMENTATION CODES:  Not applicable  INTERVENTION:  Regular diet, snacks TID, double protein portions  NUTRITION DIAGNOSIS:  Increased nutrient needs related to (pregnancy and fetal growth requirements) as evidenced by (31 weeks IUP). GOAL:  Patient will meet greater than or equal to 90% of their needs, Weight gain  MONITOR:  Weight trends  REASON FOR ASSESSMENT:  Antenatal   ASSESSMENT:  31 1/7 weeks, PROM. Wt 07/03/18 170 lbs, BMI 29.2. Weight stable to date with no weight gain. Delivery planned for 34 weeks.  Diet Order:   Diet Order            Diet regular Room service appropriate? Yes; Fluid consistency: Pudding Thick  Diet effective now             EDUCATION NEEDS: none identifed  Skin:  Skin Assessment: Reviewed RN Assessment  Last BM:  1/3  Height:   Ht Readings from Last 1 Encounters:  09/27/18 5\' 4"  (1.626 m)    Weight:   Wt Readings from Last 1 Encounters:  09/27/18 77.6 kg    Ideal Body Weight:   120 lbs  BMI:  Body mass index is 29.35 kg/m.  Estimated Nutritional Needs:   Kcal:  2000-2200  Protein:  90-100 g  Fluid:  2.3 L    Elisabeth Cara M.Odis Luster LDN Neonatal Nutrition Support Specialist/RD III Pager [phone]      Phone [phone]

## 2018-10-06 LAB — TYPE AND SCREEN
ABO/RH(D): O POS
Antibody Screen: NEGATIVE

## 2018-10-06 NOTE — Progress Notes (Signed)
Patient ID: Alicia Singleton, female   DOB: May 10, 1991, 28 y.o.   MRN: [id number] Pt without complaints. GFM continues to leak clear fluid.  Vaginal d/c and odor has improved since diflucan yesterday  VSSAF FHR 150s cat 1 Rare ctxs Reactive NST   Abd Gravid nt LE:  Neg homans bilaterally  PPROM at 31 2/7 - stable.  No sxs of chorio.   S/P latency Abx and BMZ series Hx of HSV - cont Valtrex - no active outbreaks at this time BPP twice weekly - reasurring C/S for delivery due to Transverse presentatiion.  At 34 weeks

## 2018-10-07 ENCOUNTER — Inpatient Hospital Stay (HOSPITAL_BASED_OUTPATIENT_CLINIC_OR_DEPARTMENT_OTHER): Payer: BLUE CROSS/BLUE SHIELD

## 2018-10-07 DIAGNOSIS — O09213 Supervision of pregnancy with history of pre-term labor, third trimester: Secondary | ICD-10-CM | POA: Diagnosis not present

## 2018-10-07 DIAGNOSIS — O4290 Premature rupture of membranes, unspecified as to length of time between rupture and onset of labor, unspecified weeks of gestation: Secondary | ICD-10-CM

## 2018-10-07 DIAGNOSIS — Z3A31 31 weeks gestation of pregnancy: Secondary | ICD-10-CM | POA: Diagnosis not present

## 2018-10-07 NOTE — Progress Notes (Signed)
Patient ID: Alicia Singleton, female   DOB: [birth year], 28 y.o.   MRN: [id number] Pt without complaints. GFM continues to leak clear fluid.   VSSAF FHR 150s cat 1 Rare ctxs Reactive NST   Abd Gravid nt LE: Neg homans bilaterally  PPROM at 31 3/7 - stable.  No sxs of chorio.   S/P latency Abx and BMZ series Hx of HSV - cont Valtrex - no active outbreaks at this time BPP twice weekly - reasurring.  Next today C/S for delivery due to Transverse presentatiion. At 34 weeks

## 2018-10-08 ENCOUNTER — Inpatient Hospital Stay (HOSPITAL_BASED_OUTPATIENT_CLINIC_OR_DEPARTMENT_OTHER): Payer: BLUE CROSS/BLUE SHIELD

## 2018-10-08 DIAGNOSIS — O09213 Supervision of pregnancy with history of pre-term labor, third trimester: Secondary | ICD-10-CM

## 2018-10-08 DIAGNOSIS — O289 Unspecified abnormal findings on antenatal screening of mother: Secondary | ICD-10-CM

## 2018-10-08 DIAGNOSIS — O4290 Premature rupture of membranes, unspecified as to length of time between rupture and onset of labor, unspecified weeks of gestation: Secondary | ICD-10-CM

## 2018-10-08 DIAGNOSIS — Z3A31 31 weeks gestation of pregnancy: Secondary | ICD-10-CM

## 2018-10-08 LAB — TYPE AND SCREEN
ABO/RH(D): O POS
Antibody Screen: NEGATIVE

## 2018-10-08 NOTE — Progress Notes (Signed)
S: feels fine - leaks rarely.Denies ctxn and VB. +FM.  O:AFVSS Gen: well appearing, NAD Pulm: NWOB Abd: soft, nontender, gravid Pad dry  A/P:27yo [id number]@ 31.4wgawith PPROM @ 17 wga admitted for inpatient management   # PPROM - risks/benefits of expectant management previously discussed - hx PPROM/PTB - declined 17OHP - Latency abx - Delivery by 34 wga or sooner prn- may consider extending to 36 wga but more likely 34 wga  # MWB - H/o genital HSV - understands if outbreak around time of delivery would require CS. Understands risks of PPROM with known HSV. Last outbreak >5 years ago. On exam today, no outbreaks. - s/p BTL  # FWB:  - Daily NSTs - Mon/Thurs BPP - BPP today - s/p BMZ 11/7/-8 and 12/26-12/27  # ROD:  - variable lie, would be for CS if position is unchanged - pt understands risks.  # Dispo: inpatient   Belva Agee MD

## 2018-10-09 NOTE — Progress Notes (Signed)
31 5/7 wks  No C/O, still leaking small amounts  VSS Afeb  Uterus soft, NT NST yesterday reactive  A/P: PPROM-S/P Latency Atb          Hx genital HSV-no current sxs, Valtrex          Variable lie, probable C/S for delivery                  BPP twice week, NST qd

## 2018-10-10 NOTE — Progress Notes (Signed)
Patient without complaints. Denies contractions.  BP (!) 94/58 (BP Location: Right Arm)   Pulse 85   Temp 97.9 F (36.6 C) (Oral)   Resp 20   Ht 5\' 4"  (1.626 m)   Wt 77.6 kg   LMP 02/28/2018 (Exact Date)   SpO2 99%   BMI 29.35 kg/m  No results found for this or any previous visit (from the past 24 hour(s)). Abdomen uterus is non tender  IMPRESSION: IUP at 31 6/7  PPROM History of HSV PLAN: No evidence of chorioamnionitis Continue Valtres Variable lie - possible C Section for delivery BPP twice weekly, NST qd

## 2018-10-11 ENCOUNTER — Inpatient Hospital Stay (HOSPITAL_BASED_OUTPATIENT_CLINIC_OR_DEPARTMENT_OTHER): Payer: BLUE CROSS/BLUE SHIELD

## 2018-10-11 DIAGNOSIS — O4290 Premature rupture of membranes, unspecified as to length of time between rupture and onset of labor, unspecified weeks of gestation: Secondary | ICD-10-CM

## 2018-10-11 DIAGNOSIS — O09213 Supervision of pregnancy with history of pre-term labor, third trimester: Secondary | ICD-10-CM

## 2018-10-11 DIAGNOSIS — Z3A32 32 weeks gestation of pregnancy: Secondary | ICD-10-CM

## 2018-10-11 LAB — TYPE AND SCREEN
ABO/RH(D): O POS
Antibody Screen: NEGATIVE

## 2018-10-11 NOTE — Progress Notes (Signed)
Patient ID: Alicia Singleton, female   DOB: 1991-06-30, 28 y.o.   MRN: [id number] Pt without complaints. GFM continues to leak clear fluid, occas d/c  VSSAF FHR 150s cat 1 Rare ctxs   Abd Gravid nt LE:  Neg homans bilaterally  PPROM at 32 0/7 S/P latency Abx and BMZ series Hx of HSV - cont Valtrex - no active outbreaks at this time BPP twice weekly - reasurring C/S for delivery due to Transverse presentatiion.  At 34 weeks

## 2018-10-12 MED ORDER — VALACYCLOVIR HCL 500 MG PO TABS
500.0000 mg | ORAL_TABLET | Freq: Every day | ORAL | Status: DC
  Administered 2018-10-12 – 2018-10-24 (×13): 500 mg via ORAL
  Filled 2018-10-12 (×14): qty 1

## 2018-10-12 NOTE — Progress Notes (Signed)
Pt comfortable.  Still leaking.  No pain, vb or ctx + FM  AF, VSS + FHT Abd - gravid, fundus NT  A/P:  PPROM 32+1 S/p Abx and BMZ HSV - start valtrex BPP 2x/wk Vtx on last [place] - will check [place] 1/22 - plan IOL if vtx, C/S 1/23 if breech/trans

## 2018-10-13 ENCOUNTER — Inpatient Hospital Stay (HOSPITAL_BASED_OUTPATIENT_CLINIC_OR_DEPARTMENT_OTHER): Payer: BLUE CROSS/BLUE SHIELD

## 2018-10-13 DIAGNOSIS — O09213 Supervision of pregnancy with history of pre-term labor, third trimester: Secondary | ICD-10-CM

## 2018-10-13 DIAGNOSIS — O4290 Premature rupture of membranes, unspecified as to length of time between rupture and onset of labor, unspecified weeks of gestation: Secondary | ICD-10-CM

## 2018-10-13 DIAGNOSIS — Z3A32 32 weeks gestation of pregnancy: Secondary | ICD-10-CM

## 2018-10-13 NOTE — Progress Notes (Signed)
Pt sleeping.  No c/o overnight.  + FHT AF, VSS  A/P:  PPROM 32+2 wks Continue current care

## 2018-10-14 LAB — TYPE AND SCREEN
ABO/RH(D): O POS
Antibody Screen: NEGATIVE

## 2018-10-14 NOTE — Progress Notes (Signed)
Pt resting comfortably  AF, VSS + FHT  A/P:  PPROM 32+3 wks S/p abx and BMZ HSV - valtrex BPP 2x/wk Scheduled for c-section but was vtx on last [place] - will monitor IOL if vtx at 34 wks

## 2018-10-15 ENCOUNTER — Inpatient Hospital Stay (HOSPITAL_BASED_OUTPATIENT_CLINIC_OR_DEPARTMENT_OTHER): Payer: BLUE CROSS/BLUE SHIELD

## 2018-10-15 DIAGNOSIS — O09213 Supervision of pregnancy with history of pre-term labor, third trimester: Secondary | ICD-10-CM

## 2018-10-15 DIAGNOSIS — O42913 Preterm premature rupture of membranes, unspecified as to length of time between rupture and onset of labor, third trimester: Secondary | ICD-10-CM

## 2018-10-15 DIAGNOSIS — O4290 Premature rupture of membranes, unspecified as to length of time between rupture and onset of labor, unspecified weeks of gestation: Secondary | ICD-10-CM | POA: Diagnosis not present

## 2018-10-15 DIAGNOSIS — Z3A32 32 weeks gestation of pregnancy: Secondary | ICD-10-CM | POA: Diagnosis not present

## 2018-10-15 LAB — CBC WITH DIFFERENTIAL/PLATELET
Basophils Absolute: 0 10*3/uL (ref 0.0–0.1)
Basophils Relative: 0 %
EOS ABS: 0.1 10*3/uL (ref 0.0–0.5)
EOS PCT: 1 %
HCT: 33.2 % — ABNORMAL LOW (ref 36.0–46.0)
Hemoglobin: 11.2 g/dL — ABNORMAL LOW (ref 12.0–15.0)
Lymphocytes Relative: 36 %
Lymphs Abs: 3.6 10*3/uL (ref 0.7–4.0)
MCH: 30.4 pg (ref 26.0–34.0)
MCHC: 33.7 g/dL (ref 30.0–36.0)
MCV: 90 fL (ref 80.0–100.0)
MONO ABS: 0.4 10*3/uL (ref 0.1–1.0)
Monocytes Relative: 4 %
Neutro Abs: 5.9 10*3/uL (ref 1.7–7.7)
Neutrophils Relative %: 59 %
Platelets: 272 10*3/uL (ref 150–400)
RBC: 3.69 MIL/uL — ABNORMAL LOW (ref 3.87–5.11)
RDW: 12.6 % (ref 11.5–15.5)
WBC: 10 10*3/uL (ref 4.0–10.5)
nRBC: 0 % (ref 0.0–0.2)

## 2018-10-15 LAB — GLUCOSE TOLERANCE, 1 HOUR: Glucose, 1 Hour GTT: 119 mg/dL (ref 70–140)

## 2018-10-15 NOTE — Progress Notes (Signed)
No current c/o.  Episode around 4 am today of pelvic pressure and CTX.  CVX 2/th/posterior.  Patient reports feeling much better now but still a little pressure.  No subj fever or chills.  No abdominal pain or change in discharge.    VSS. AF.  FHT Cat I  Gen: A&O x 3 Abd: soft, NT Ext : no c/c/e  27yo [id number] at [redacted]w[redacted]d with PPROM -s/p BMZ and abx -Will check CBC today -BPP 2x/wk -VTX on last u/s-monitor and IOL at 34 vs scheduled C/S  -HSV-Valtrex  Mitchel Honour, DO

## 2018-10-15 NOTE — Progress Notes (Signed)
50 gram glucose load consumed for 1 hour GTT.

## 2018-10-15 NOTE — Progress Notes (Signed)
RN called into patients room at 0340. Patient complaining of increasing "pressure in her bottom" that awoke her from sleep. She notices some gradual tightening and describes it as a 4/10. Patient placed on FHR monitor and Toco monitor. Dr. Renaldo Fiddler notified; order received for cervical exam. Cervix was still thick and posterior. Patient removed from tocometer per Dr. Renaldo Fiddler and encouraged to call if contraction feel stronger, closer together, or she feels as if she is bearing down. Will continue with POC.

## 2018-10-16 NOTE — Progress Notes (Signed)
Patient ID: Alicia Singleton, female   DOB: 1991-04-02, 28 y.o.   MRN: [id number] Pt without complaints. GFM continues to leak clear fluid  VSSAF FHR 150s cat 1 Rare ctxs BPP 6/8  (8/10 with NST) - verbal 10/15/18  Abd Gravid nt LE: Neg homans bilaterally  PPROM at 32 5/7 S/P latency Abx and BMZ series Hx of HSV - cont Valtrex - no active outbreaks at this time BPP twice weekly - reasurring C/S for delivery due to Transverse presentatiion but variable (was vertex 3 days ago). At 34 weeks 10/25/2018 scheduled

## 2018-10-17 LAB — TYPE AND SCREEN
ABO/RH(D): O POS
Antibody Screen: NEGATIVE

## 2018-10-17 NOTE — Progress Notes (Signed)
No complaints. Good Fetal movement.  BP 115/74 (BP Location: Right Arm)   Pulse 88   Temp 98.7 F (37.1 C) (Oral)   Resp 18   Ht 5\' 4"  (1.626 m)   Wt 77.6 kg   LMP 02/28/2018 (Exact Date)   SpO2 100%   BMI 29.35 kg/m  Results for orders placed or performed during the hospital encounter of 09/27/18 (from the past 24 hour(s))  Type and screen [facility] [place]     Status: None   Collection Time: 10/17/18  6:20 AM  Result Value Ref Range   ABO/RH(D) O POS    Antibody Screen NEG    Sample Expiration      10/20/2018 Performed at [facility], [address], [place], [place] [address]    Abdomen soft and non tender  IMPRESSION/PLAN: IUP at 32 6/7 weeks PPROM  Received latency antibiotics and steroids  No sign of chorioamnionitis  Induction at 34 weeks  Variable presentation - monitor at time of delivery History of HSV - Valtrex

## 2018-10-18 ENCOUNTER — Inpatient Hospital Stay (HOSPITAL_BASED_OUTPATIENT_CLINIC_OR_DEPARTMENT_OTHER): Payer: BLUE CROSS/BLUE SHIELD

## 2018-10-18 ENCOUNTER — Telehealth (HOSPITAL_COMMUNITY): Payer: Self-pay | Admitting: *Deleted

## 2018-10-18 DIAGNOSIS — Z3A33 33 weeks gestation of pregnancy: Secondary | ICD-10-CM | POA: Diagnosis not present

## 2018-10-18 DIAGNOSIS — O4290 Premature rupture of membranes, unspecified as to length of time between rupture and onset of labor, unspecified weeks of gestation: Secondary | ICD-10-CM

## 2018-10-18 DIAGNOSIS — O09213 Supervision of pregnancy with history of pre-term labor, third trimester: Secondary | ICD-10-CM

## 2018-10-18 NOTE — Progress Notes (Signed)
No complaints.  + FM.  No ctx, vb or pain  AF, VSS + FHT Gen - NAD Abd - gravid, fundus NT Ext - NT  [place]:  vtx BPP 8/8  A/P:  PPROM, 33 wks S/p Abx & BMZ BPP 2x/wk Schedule IOL 34 wks Continue valtrex

## 2018-10-18 NOTE — Telephone Encounter (Signed)
Preadmission screen  

## 2018-10-19 NOTE — Progress Notes (Signed)
33 1/7 wks  AF leak daily, no bleeding No C/O  Vitals:   10/18/18 1634 10/18/18 1914  BP: 114/75 115/74  Pulse: 91 90  Resp: 18 17  Temp: 97.6 F (36.4 C) 98.1 F (36.7 C)  SpO2: 99% 100%   Abdomen soft, NT  BPP 10/18/18>AFI normal, vtx, BPP 8/8  A/P: PPROM         S/P Abx/BMZ         Valtrex          BPP twice/week          IOL 34 weeks

## 2018-10-20 LAB — TYPE AND SCREEN
ABO/RH(D): O POS
Antibody Screen: NEGATIVE

## 2018-10-20 NOTE — Progress Notes (Signed)
33 2/7  No C/O , no bleeding Small leaks of AF  Vitals:   10/20/18 0547 10/20/18 0856  BP: 95/61 104/76  Pulse: 84 85  Resp: 16 18  Temp: 97.9 F (36.6 C) 98.4 F (36.9 C)  SpO2: 100% 100%   Uterus Soft, NT NST + accels  A/P:PPROM         S/P Abx/BMZ         Valtrex          BPP twice/week          IOL 34 weeks

## 2018-10-21 NOTE — Progress Notes (Signed)
33 3/7wks  No C/O, no change  Vitals:   10/20/18 1933 10/21/18 0602  BP:  96/62  Pulse: 90 86  Resp: 17 16  Temp: 98 F (36.7 C) 97.8 F (36.6 C)  SpO2: 100% 99%   Uterus soft, NT NST +accels  A/P: PPROM S/P Abx/BMZ Valtrex BPP twice/week IOL 34 weeks

## 2018-10-22 ENCOUNTER — Inpatient Hospital Stay (HOSPITAL_BASED_OUTPATIENT_CLINIC_OR_DEPARTMENT_OTHER): Payer: BLUE CROSS/BLUE SHIELD

## 2018-10-22 DIAGNOSIS — O42913 Preterm premature rupture of membranes, unspecified as to length of time between rupture and onset of labor, third trimester: Secondary | ICD-10-CM

## 2018-10-22 DIAGNOSIS — O4290 Premature rupture of membranes, unspecified as to length of time between rupture and onset of labor, unspecified weeks of gestation: Secondary | ICD-10-CM

## 2018-10-22 DIAGNOSIS — Z362 Encounter for other antenatal screening follow-up: Secondary | ICD-10-CM

## 2018-10-22 DIAGNOSIS — Z3A33 33 weeks gestation of pregnancy: Secondary | ICD-10-CM | POA: Diagnosis not present

## 2018-10-22 NOTE — Progress Notes (Signed)
S: No c/o. Denies contractions and bleeding. Has scant LOF. +FM.  O: AFVSS NAD, A&O NWOB Abd soft, nondistended, gravid No bleeding or overt LOF seen on underwear  A/P: 28 yo w/PPROM, currently 33.4 wga. PPROM - no s/s infxn, aburption, etc.  Cont expectant manag't  FWB reassuring overall  S/p BMZ x 2  S/p latency abx BPP 8/10 today - off for breathing but overall reassuring. Recommend repeat tomorrow. H/o HSV - no active outbreaks ROD - planning VTOL @ 34.0 wga as long as remains vertex    Rosie Fate MD

## 2018-10-23 ENCOUNTER — Inpatient Hospital Stay (HOSPITAL_BASED_OUTPATIENT_CLINIC_OR_DEPARTMENT_OTHER): Payer: BLUE CROSS/BLUE SHIELD

## 2018-10-23 DIAGNOSIS — Z3A33 33 weeks gestation of pregnancy: Secondary | ICD-10-CM

## 2018-10-23 DIAGNOSIS — Z362 Encounter for other antenatal screening follow-up: Secondary | ICD-10-CM

## 2018-10-23 DIAGNOSIS — O09213 Supervision of pregnancy with history of pre-term labor, third trimester: Secondary | ICD-10-CM

## 2018-10-23 DIAGNOSIS — O4290 Premature rupture of membranes, unspecified as to length of time between rupture and onset of labor, unspecified weeks of gestation: Secondary | ICD-10-CM | POA: Diagnosis not present

## 2018-10-23 LAB — TYPE AND SCREEN
ABO/RH(D): O POS
Antibody Screen: NEGATIVE

## 2018-10-23 NOTE — Progress Notes (Signed)
Patient doing well. No complaints BP (!) 93/59 (BP Location: Right Arm)   Pulse 83   Temp 98 F (36.7 C) (Oral)   Resp 18   Ht 5\' 4"  (1.626 m)   Wt 77.6 kg   LMP 02/28/2018 (Exact Date)   SpO2 99%   BMI 29.35 kg/m  Abdomen soft non tender   IMPRESSION: IUP at 33 5/7  PPROM History of HSV  PLAN: Continue current plan IOL on Thursday

## 2018-10-24 NOTE — Progress Notes (Signed)
No complaints.  No pain, vb or ctx.  + FM  AF, VSS + FHT Gen - NAD Abd - gravid, NT  PPROM @ 33+6 wks Plan for IOL tomorrow - GBS neg 09/27/18 Continue valtrex

## 2018-10-24 NOTE — Progress Notes (Signed)
I checked in with Alicia Singleton about how she was feeling about delivery tomorrow.  She is eager and ready to get this phase over with.  She will be supported by her boyfriend and mother and is feeling very positive.  She stated no other concerns at this time.  [address] Dyanne Carrel, Bcc Pager, [phone] 4:42 PM    10/24/18 1600  Clinical Encounter Type  Visited With Patient  Visit Type Spiritual support

## 2018-10-25 ENCOUNTER — Encounter (HOSPITAL_COMMUNITY)
Admission: AD | Disposition: A | Discharge status: Home / Self Care | Payer: Self-pay | Source: Home / Self Care | Attending: Obstetrics and Gynecology

## 2018-10-25 ENCOUNTER — Inpatient Hospital Stay (HOSPITAL_COMMUNITY): Payer: BLUE CROSS/BLUE SHIELD | Admitting: Anesthesiology

## 2018-10-25 ENCOUNTER — Inpatient Hospital Stay (HOSPITAL_COMMUNITY)
Admit: 2018-10-25 | Discharge: 2018-10-25 | Disposition: A | Discharge status: Home / Self Care | Payer: BLUE CROSS/BLUE SHIELD | Attending: Obstetrics & Gynecology | Admitting: Obstetrics & Gynecology

## 2018-10-25 DIAGNOSIS — O42919 Preterm premature rupture of membranes, unspecified as to length of time between rupture and onset of labor, unspecified trimester: Secondary | ICD-10-CM | POA: Diagnosis present

## 2018-10-25 LAB — CBC
HCT: 34.2 % — ABNORMAL LOW (ref 36.0–46.0)
HEMOGLOBIN: 11.5 g/dL — AB (ref 12.0–15.0)
MCH: 30.3 pg (ref 26.0–34.0)
MCHC: 33.6 g/dL (ref 30.0–36.0)
MCV: 90 fL (ref 80.0–100.0)
Platelets: 275 10*3/uL (ref 150–400)
RBC: 3.8 MIL/uL — ABNORMAL LOW (ref 3.87–5.11)
RDW: 13 % (ref 11.5–15.5)
WBC: 9.9 10*3/uL (ref 4.0–10.5)

## 2018-10-25 LAB — TYPE AND SCREEN
ABO/RH(D): O POS
Antibody Screen: NEGATIVE

## 2018-10-25 LAB — RPR: RPR Ser Ql: NONREACTIVE

## 2018-10-25 SURGERY — Surgical Case
Anesthesia: Regional

## 2018-10-25 MED ORDER — EPHEDRINE 5 MG/ML INJ
10.0000 mg | INTRAVENOUS | Status: DC | PRN
  Filled 2018-10-25: qty 2

## 2018-10-25 MED ORDER — FLEET ENEMA 7-19 GM/118ML RE ENEM: 1.0000 | ENEMA | RECTAL | Status: DC | PRN

## 2018-10-25 MED ORDER — TERBUTALINE SULFATE 1 MG/ML IJ SOLN: 0.2500 mg | Freq: Once | INTRAMUSCULAR | Status: DC | PRN

## 2018-10-25 MED ORDER — OXYCODONE-ACETAMINOPHEN 5-325 MG PO TABS: 1.0000 | ORAL_TABLET | ORAL | Status: DC | PRN

## 2018-10-25 MED ORDER — OXYTOCIN 40 UNITS IN NORMAL SALINE INFUSION - SIMPLE MED
2.5000 [IU]/h | INTRAVENOUS | Status: DC
  Administered 2018-10-26: 2.5 [IU]/h via INTRAVENOUS
  Filled 2018-10-25: qty 1000

## 2018-10-25 MED ORDER — OXYCODONE-ACETAMINOPHEN 5-325 MG PO TABS: 2.0000 | ORAL_TABLET | ORAL | Status: DC | PRN

## 2018-10-25 MED ORDER — SOD CITRATE-CITRIC ACID 500-334 MG/5ML PO SOLN: 30.0000 mL | ORAL | Status: DC | PRN

## 2018-10-25 MED ORDER — FENTANYL 2.5 MCG/ML BUPIVACAINE 1/10 % EPIDURAL INFUSION (WH - ANES)
14.0000 mL/h | INTRAMUSCULAR | Status: DC | PRN
  Administered 2018-10-25 (×2): 14 mL/h via EPIDURAL
  Filled 2018-10-25 (×2): qty 100

## 2018-10-25 MED ORDER — LACTATED RINGERS IV SOLN
500.0000 mL | INTRAVENOUS | Status: DC | PRN
  Administered 2018-10-25: 700 mL via INTRAVENOUS

## 2018-10-25 MED ORDER — DIPHENHYDRAMINE HCL 50 MG/ML IJ SOLN: 12.5000 mg | INTRAMUSCULAR | Status: DC | PRN

## 2018-10-25 MED ORDER — PHENYLEPHRINE 40 MCG/ML (10ML) SYRINGE FOR IV PUSH (FOR BLOOD PRESSURE SUPPORT)
80.0000 ug | PREFILLED_SYRINGE | INTRAVENOUS | Status: DC | PRN
  Filled 2018-10-25 (×2): qty 10

## 2018-10-25 MED ORDER — OXYTOCIN BOLUS FROM INFUSION
500.0000 mL | Freq: Once | INTRAVENOUS | Status: AC
  Administered 2018-10-26: 500 mL via INTRAVENOUS

## 2018-10-25 MED ORDER — LIDOCAINE HCL (PF) 1 % IJ SOLN
30.0000 mL | INTRAMUSCULAR | Status: DC | PRN
  Filled 2018-10-25: qty 30

## 2018-10-25 MED ORDER — LACTATED RINGERS IV SOLN: 500.0000 mL | Freq: Once | INTRAVENOUS | Status: DC

## 2018-10-25 MED ORDER — OXYTOCIN 40 UNITS IN NORMAL SALINE INFUSION - SIMPLE MED
1.0000 m[IU]/min | INTRAVENOUS | Status: DC
  Administered 2018-10-25: 2 m[IU]/min via INTRAVENOUS
  Filled 2018-10-25: qty 1000

## 2018-10-25 MED ORDER — LIDOCAINE HCL (PF) 1 % IJ SOLN
INTRAMUSCULAR | Status: DC | PRN
  Administered 2018-10-25 (×2): 5 mL via EPIDURAL

## 2018-10-25 MED ORDER — LACTATED RINGERS IV SOLN
INTRAVENOUS | Status: DC
  Administered 2018-10-25 (×3): via INTRAVENOUS

## 2018-10-25 MED ORDER — PHENYLEPHRINE 40 MCG/ML (10ML) SYRINGE FOR IV PUSH (FOR BLOOD PRESSURE SUPPORT)
80.0000 ug | PREFILLED_SYRINGE | INTRAVENOUS | Status: DC | PRN
  Filled 2018-10-25: qty 10

## 2018-10-25 MED ORDER — FENTANYL CITRATE (PF) 100 MCG/2ML IJ SOLN: 50.0000 ug | INTRAMUSCULAR | Status: DC | PRN

## 2018-10-25 NOTE — Progress Notes (Signed)
Alicia Singleton is a 28 y.o. [id number] at [redacted]w[redacted]d by ultrasound admitted for PPROM at 17 weeks with presumed reseal of membranes.  She has been in house since 30 weeks and has received BMZ x 2 courses (last 12/26 and 27).  Patient has h/o HSV and has been on suppression with no current outbreak or prodromal symptoms. Fetal lie has been unstable the last few weeks.  Patient has h/o PPROM and had proven reseal of membranes with the tampon test in her first pregnancy.  Delivery was delayed (after 34 weeks) and unfortunately, before 35 weeks, chorio developed.  The patient declines delaying delivery given this history.    Subjective: No complaints.    Objective: BP 112/70   Pulse 90   Temp 97.8 F (36.6 C) (Oral)   Resp 18   Ht 5\' 4"  (1.626 m)   Wt 77.6 kg   LMP 02/28/2018 (Exact Date)   SpO2 98%   BMI 29.35 kg/m  No intake/output data recorded. No intake/output data recorded.  FHT:  FHR: 145 bpm, variability: moderate,  accelerations:  Present,  decelerations:  Absent UC:   irregular, every 8 minutes SVE:   Dilation: 2 Effacement (%): 50 Station: -2 Exam by:: J.Cox, RN Bedside u/s performed and presentation: vertex On my cvx exam, head is not well applied.  Labs: Lab Results  Component Value Date   WBC 9.9 10/25/2018   HGB 11.5 (L) 10/25/2018   HCT 34.2 (L) 10/25/2018   MCV 90.0 10/25/2018   PLT 275 10/25/2018    Assessment / Plan: Induction of labor due to PPROM,  progressing well on pitocin  Labor: Progressing on Pitocin, will continue to increase then AROM Preeclampsia:  n/a Fetal Wellbeing:  Category I Pain Control:  Planning epidural I/D:  n/a Anticipated MOD:  NSVD  Mitchel Honour 10/25/2018, 8:27 AM

## 2018-10-25 NOTE — Progress Notes (Signed)
SVE 3-4/50/-2, IUPC placed  Mitchel Honour, DO

## 2018-10-25 NOTE — Progress Notes (Signed)
Comfortable with CLEA SVE 3/50/-2 Pitocin 44mU  Mitchel Honour, DO

## 2018-10-25 NOTE — Anesthesia Procedure Notes (Signed)
Epidural Patient location during procedure: OB  Staffing Anesthesiologist: Anniah Glick, MD Performed: anesthesiologist   Preanesthetic Checklist Completed: patient identified, site marked, surgical consent, pre-op evaluation, timeout performed, IV checked, risks and benefits discussed and monitors and equipment checked  Epidural Patient position: sitting Prep: DuraPrep Patient monitoring: heart rate, continuous pulse ox and blood pressure Approach: right paramedian Location: L3-L4 Injection technique: LOR saline  Needle:  Needle type: Tuohy  Needle gauge: 17 G Needle length: 9 cm and 9 Needle insertion depth: 6 cm Catheter type: closed end flexible Catheter size: 20 Guage Catheter at skin depth: 10 cm Test dose: negative  Assessment Events: blood not aspirated, injection not painful, no injection resistance, negative IV test and no paresthesia  Additional Notes Patient identified. Risks/Benefits/Options discussed with patient including but not limited to bleeding, infection, nerve damage, paralysis, failed block, incomplete pain control, headache, blood pressure changes, nausea, vomiting, reactions to medication both or allergic, itching and postpartum back pain. Confirmed with bedside nurse the patient's most recent platelet count. Confirmed with patient that they are not currently taking any anticoagulation, have any bleeding history or any family history of bleeding disorders. Patient expressed understanding and wished to proceed. All questions were answered. Sterile technique was used throughout the entire procedure. Please see nursing notes for vital signs. Test dose was given through epidural needle and negative prior to continuing to dose epidural or start infusion. Warning signs of high block given to the patient including shortness of breath, tingling/numbness in hands, complete motor block, or any concerning symptoms with instructions to call for help. Patient was given  instructions on fall risk and not to get out of bed. All questions and concerns addressed with instructions to call with any issues.     

## 2018-10-25 NOTE — Progress Notes (Signed)
CVX 2/50/-2, AROM clear. Continue pitocin. CLEA when desired.  Mitchel Honour, DO

## 2018-10-25 NOTE — Anesthesia Preprocedure Evaluation (Signed)

## 2018-10-25 NOTE — Anesthesia Pain Management Evaluation Note (Signed)
  [facility]NA Pain Management Visit Note  Patient: Alicia Singleton, 28 y.o., female  "Hello I am a member of the anesthesia team [facility]. We have an anesthesia team available at all times to provide care throughout the hospital, including epidural management and anesthesia for C-section. I don't know your plan for the delivery whether it a natural birth, water birth, IV sedation, nitrous supplementation, doula or epidural, but we want to meet your pain goals."   1.Was your pain managed to your expectations on prior hospitalizations?   Yes   2.What is your expectation for pain management during this hospitalization?     Epidural  3.How can we help you reach that goal? Epidural when desired  Record the patient's initial score and the patient's pain goal.   Pain: 0  Pain Goal: 5 [facility] wants you to be able to say your pain was always managed very well.  Alicia Singleton 10/25/2018

## 2018-10-26 ENCOUNTER — Encounter (HOSPITAL_COMMUNITY): Payer: Self-pay | Admitting: Neonatal-Perinatal Medicine

## 2018-10-26 LAB — CBC
HCT: 31.3 % — ABNORMAL LOW (ref 36.0–46.0)
Hemoglobin: 10.6 g/dL — ABNORMAL LOW (ref 12.0–15.0)
MCH: 30.4 pg (ref 26.0–34.0)
MCHC: 33.9 g/dL (ref 30.0–36.0)
MCV: 89.7 fL (ref 80.0–100.0)
Platelets: 249 10*3/uL (ref 150–400)
RBC: 3.49 MIL/uL — ABNORMAL LOW (ref 3.87–5.11)
RDW: 12.9 % (ref 11.5–15.5)
WBC: 12.3 10*3/uL — ABNORMAL HIGH (ref 4.0–10.5)
nRBC: 0 % (ref 0.0–0.2)

## 2018-10-26 MED ORDER — VENLAFAXINE HCL ER 150 MG PO CP24: 150.0000 mg | ORAL_CAPSULE | Freq: Every day | ORAL | Status: DC

## 2018-10-26 MED ORDER — SENNOSIDES-DOCUSATE SODIUM 8.6-50 MG PO TABS
2.0000 | ORAL_TABLET | ORAL | Status: DC
  Administered 2018-10-26 – 2018-10-27 (×2): 2 via ORAL
  Filled 2018-10-26 (×2): qty 2

## 2018-10-26 MED ORDER — PRENATAL MULTIVITAMIN CH
1.0000 | ORAL_TABLET | Freq: Every day | ORAL | Status: DC
  Administered 2018-10-26 – 2018-10-27 (×2): 1 via ORAL
  Filled 2018-10-26 (×2): qty 1

## 2018-10-26 MED ORDER — OXYCODONE-ACETAMINOPHEN 5-325 MG PO TABS: 1.0000 | ORAL_TABLET | ORAL | Status: DC | PRN

## 2018-10-26 MED ORDER — ACETAMINOPHEN 325 MG PO TABS: 650.0000 mg | ORAL_TABLET | ORAL | Status: DC | PRN

## 2018-10-26 MED ORDER — ONDANSETRON HCL 4 MG PO TABS: 4.0000 mg | ORAL_TABLET | ORAL | Status: DC | PRN

## 2018-10-26 MED ORDER — DIPHENHYDRAMINE HCL 25 MG PO CAPS: 25.0000 mg | ORAL_CAPSULE | Freq: Four times a day (QID) | ORAL | Status: DC | PRN

## 2018-10-26 MED ORDER — TETANUS-DIPHTH-ACELL PERTUSSIS 5-2.5-18.5 LF-MCG/0.5 IM SUSP: 0.5000 mL | Freq: Once | INTRAMUSCULAR | Status: DC

## 2018-10-26 MED ORDER — BENZOCAINE-MENTHOL 20-0.5 % EX AERO: 1.0000 "application " | INHALATION_SPRAY | CUTANEOUS | Status: DC | PRN

## 2018-10-26 MED ORDER — COCONUT OIL OIL
1.0000 "application " | TOPICAL_OIL | Status: DC | PRN
  Administered 2018-10-26: 1 via TOPICAL
  Filled 2018-10-26: qty 120

## 2018-10-26 MED ORDER — SIMETHICONE 80 MG PO CHEW: 80.0000 mg | CHEWABLE_TABLET | ORAL | Status: DC | PRN

## 2018-10-26 MED ORDER — ZOLPIDEM TARTRATE 5 MG PO TABS: 5.0000 mg | ORAL_TABLET | Freq: Every evening | ORAL | Status: DC | PRN

## 2018-10-26 MED ORDER — OXYCODONE-ACETAMINOPHEN 5-325 MG PO TABS: 2.0000 | ORAL_TABLET | ORAL | Status: DC | PRN

## 2018-10-26 MED ORDER — DIBUCAINE 1 % RE OINT: 1.0000 "application " | TOPICAL_OINTMENT | RECTAL | Status: DC | PRN

## 2018-10-26 MED ORDER — VENLAFAXINE HCL ER 150 MG PO CP24
150.0000 mg | ORAL_CAPSULE | Freq: Every day | ORAL | Status: DC
  Administered 2018-10-26 – 2018-10-28 (×3): 150 mg via ORAL
  Filled 2018-10-26 (×3): qty 1

## 2018-10-26 MED ORDER — WITCH HAZEL-GLYCERIN EX PADS: 1.0000 "application " | MEDICATED_PAD | CUTANEOUS | Status: DC | PRN

## 2018-10-26 MED ORDER — ONDANSETRON HCL 4 MG/2ML IJ SOLN: 4.0000 mg | INTRAMUSCULAR | Status: DC | PRN

## 2018-10-26 MED ORDER — IBUPROFEN 600 MG PO TABS
600.0000 mg | ORAL_TABLET | Freq: Four times a day (QID) | ORAL | Status: DC
  Administered 2018-10-26 – 2018-10-28 (×9): 600 mg via ORAL
  Filled 2018-10-26 (×9): qty 1

## 2018-10-26 NOTE — Progress Notes (Signed)
Post Partum Day 1 after PPROM @ 17 wga, reseal membranes, and IOL for PPROM @ 34 wga.   Subjective: no complaints, up ad lib, voiding and tolerating PO  Objective: Blood pressure 116/87, pulse 84, temperature 98.6 F (37 C), temperature source Oral, resp. rate 16, height 5\' 4"  (1.626 m), weight 78.5 kg, last menstrual period 02/28/2018, SpO2 99 %, unknown if currently breastfeeding.  Physical Exam:  General: alert, cooperative and appears stated age Lochia: appropriate Uterine Fundus: firm Incision: N/A DVT Evaluation: No evidence of DVT seen on physical exam. Negative Homan's sign. No cords or calf tenderness. No significant calf/ankle edema.  Recent Labs    10/25/18 0422 10/26/18 0708  HGB 11.5* 10.6*  HCT 34.2* 31.3*    Assessment/Plan: Plan for discharge tomorrow  Baby girl [facility] in NICU.    LOS: 29 days   Ranae Pila 10/26/2018, 8:57 AM

## 2018-10-26 NOTE — Lactation Note (Signed)
This note was copied from a baby's chart. Lactation Consultation Note: Initial visit with this mom for NICU baby born at 73 w 1 day now 20 hours old. Mom has been in NICU visiting baby. Now doing her first pumping. RN has instructed her in setup, use and cleaning of pump pieces. WIC has been to see her but she was in NICU Hopes to get pump for home from them. No questions at present. Has NICU booklet. BF brochure given Reviewed our phone number to call with questions. NICU can call us if she wants assist with latch.   Patient Name: Alicia Singleton [id number] Date: 10/26/2018 Reason for consult: Initial assessment;NICU baby;Late-preterm 34-36.6wks   Maternal Data Has patient been taught Hand Expression?: Yes Does the patient have breastfeeding experience prior to this delivery?: Yes  Feeding    LATCH Score                   Interventions    Lactation Tools Discussed/Used WIC Program: Yes Initiated by:: RN   Consult Status Consult Status: Follow-up Date: 10/27/18 Follow-up type: In-patient    Pamelia Hoit 10/26/2018, 10:57 AM

## 2018-10-27 NOTE — Lactation Note (Signed)
This note was copied from a baby's chart. Lactation Consultation Note  Patient Name: Alicia Singleton [id number] Date: 10/27/2018    [facility] Follow Up Visit:  Attempted to visit with mother, however, she was not in her room.  I will try to return later today.                        Aubriee Szeto R Elon Eoff 10/27/2018, 12:57 PM

## 2018-10-27 NOTE — Lactation Note (Signed)
This note was copied from a baby's chart. Lactation Consultation Note  Patient Name: Alicia Singleton [id number] Date: 10/27/2018    [facility] Follow Up Visit:  Second attempt to visit with mother today, however, she is not in her room.                   Alicia Singleton R Rhyatt Muska 10/27/2018, 5:21 PM

## 2018-10-27 NOTE — Discharge Summary (Signed)
Obstetric Discharge Summary Reason for Admission: PPROM Pt with PPROM @ 17 wks.  She was admitted at 23 wks for BMZ and then stopped LOF and rpt anmisure was negative.  She was followed closely with weekly FHT, CBC and AFI.  She then began leaking fluid again (clear) and desired inpatient mngt. She received an additional course of BMZ and latency abx. She was induced at 58 wga given PPROM and risk infection in the future.  Prenatal Procedures: multiple BPPs/US Intrapartum Procedures: spontaneous vaginal delivery Postpartum Procedures: none Complications-Operative and Postpartum: none Hemoglobin  Date Value Ref Range Status  10/26/2018 10.6 (L) 12.0 - 15.0 g/dL Final  53/00/5110 21.1 g/dL Final   HCT  Date Value Ref Range Status  10/26/2018 31.3 (L) 36.0 - 46.0 % Final  05/07/2015 34 % Final    Physical Exam:  General: alert, cooperative and appears stated age Lochia: appropriate Uterine Fundus: firm Incision: N/A DVT Evaluation: No evidence of DVT seen on physical exam. Negative Homan's sign. No cords or calf tenderness. No significant calf/ankle edema.  Discharge Diagnoses: NSVD after IOL s/s PPROM. PTB @ 34 wga.  Discharge Information: Date: 10/27/2018 Activity: pelvic rest Diet: routine Medications: None Condition: stable Instructions: refer to practice specific booklet Discharge to: home   Newborn Data: Live born female  Birth Weight: 5 lb 0.8 oz (2290 g) APGAR: 8, 10  Newborn Delivery   Birth date/time:  10/26/2018 00:58:00 Delivery type:  Vaginal, Spontaneous     Home with NICU.  Ranae Pila 10/27/2018, 7:44 AM

## 2018-10-27 NOTE — Anesthesia Postprocedure Evaluation (Signed)
Anesthesia Post Note  Patient: Alicia Singleton  Procedure(s) Performed: AN AD HOC LABOR EPIDURAL     Patient location during evaluation: Mother Baby Anesthesia Type: Epidural Level of consciousness: awake Pain management: satisfactory to patient Vital Signs Assessment: post-procedure vital signs reviewed and stable Respiratory status: spontaneous breathing Cardiovascular status: stable Anesthetic complications: no    Last Vitals:  Vitals:   10/27/18 0357 10/27/18 0724  BP: 105/63 115/76  Pulse: 67 78  Resp: 18 16  Temp: 36.9 C 37.1 C  SpO2: 100% 100%    Last Pain:  Vitals:   10/27/18 0724  TempSrc: Oral  PainSc:    Pain Goal: Patients Stated Pain Goal: 3 (10/26/18 1947)                 Alicia Singleton

## 2018-10-27 NOTE — Progress Notes (Signed)
Pt requests to stay additional night given technically delivery after midnight and baby in NICU. EL

## 2018-10-27 NOTE — Lactation Note (Signed)
This note was copied from a baby's chart. Lactation Consultation Note  Patient Name: Alicia Singleton [id number] Date: 10/27/2018 Reason for consult: Follow-up assessment;Late-preterm 34-36.6wks  P3 mother whose infant is now 82 hours old.  Mother breast fed her last child for 18 months.  This baby is 34+1 weeks and in the NICU.  Mother was in her room and pumping when I arrived.  She had spent a lot of time in NICU today and gave me an update on her progress.  Mother had no questions/concerns about breast feeding.  She will continue to pump 8-12 times/24 hours and will include hand expression before/after feedings to help increase milk supply.    Informed mother that she can pump at bedside or in the private rooms in the NICU if she desires.  Mother will consider this option.  She will be obtaining a DEBP from her insurance company.  Also informed mother that she may use our services when she visits the NICU if she needs help latching.  Mother appreciative.  She will call for any questions/concerns she may have.    Maternal Data Formula Feeding for Exclusion: No Has patient been taught Hand Expression?: Yes Does the patient have breastfeeding experience prior to this delivery?: Yes  Feeding Feeding Type: Formula Nipple Type: Nfant Slow Flow (purple)  LATCH Score                   Interventions    Lactation Tools Discussed/Used WIC Program: Yes   Consult Status Consult Status: Follow-up Date: 10/28/18 Follow-up type: In-patient    Dora Sims 10/27/2018, 6:48 PM

## 2018-10-28 NOTE — Progress Notes (Signed)
Please see DCS. Seen today and no changes. DC Home. EL

## 2019-10-03 ENCOUNTER — Ambulatory Visit: Payer: Medicaid Other | Attending: Internal Medicine

## 2019-10-03 DIAGNOSIS — Z20822 Contact with and (suspected) exposure to covid-19: Secondary | ICD-10-CM

## 2019-10-05 LAB — NOVEL CORONAVIRUS, NAA: SARS-CoV-2, NAA: DETECTED — AB

## 2020-04-25 IMAGING — US US MFM FETAL BPP W/O NON-STRESS
1 series · 10 of 10 positions shown · non-contrast
Comparison: none

[Series 1: us mfm fetal bpp w/o non-stress · 10 acquisitions, 10 frames shown]
[im 1/10]
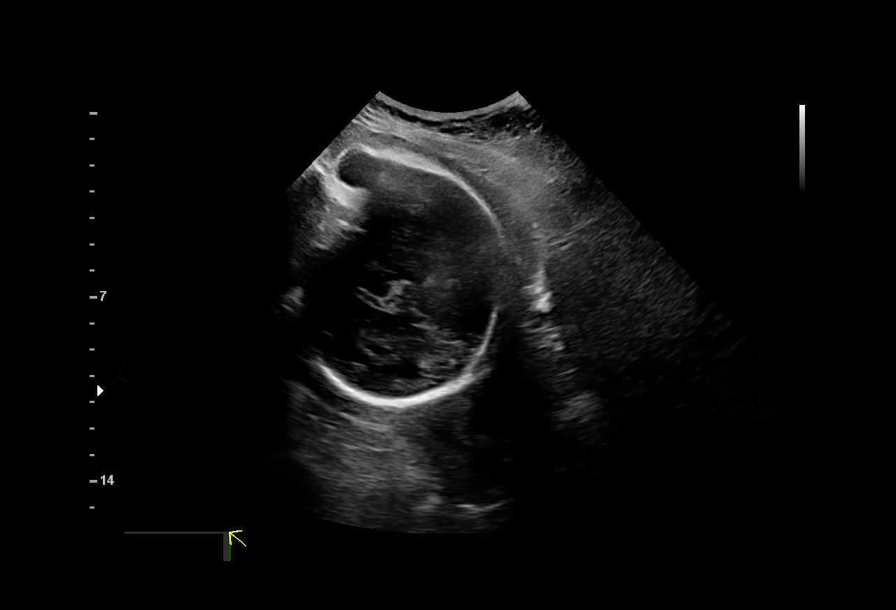
[im 2/10]
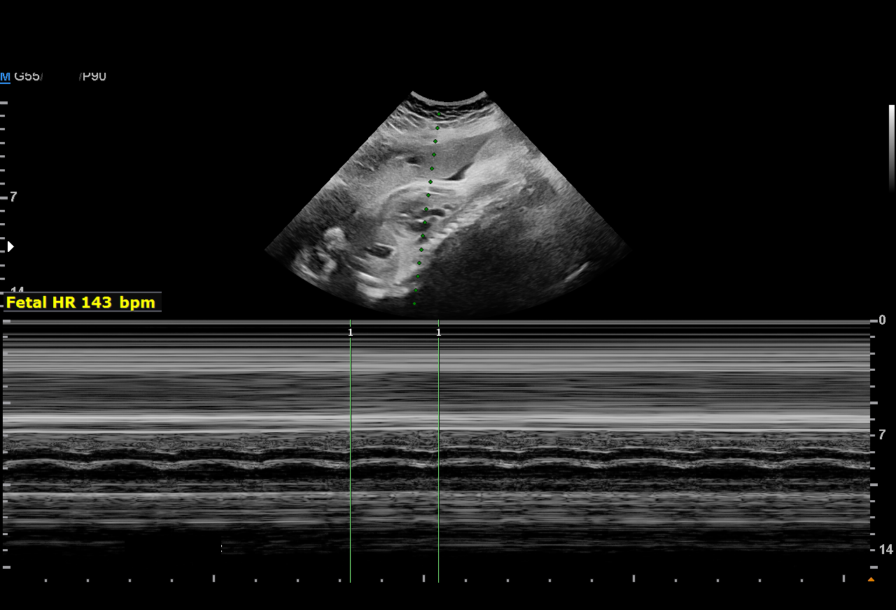
[im 3/10]
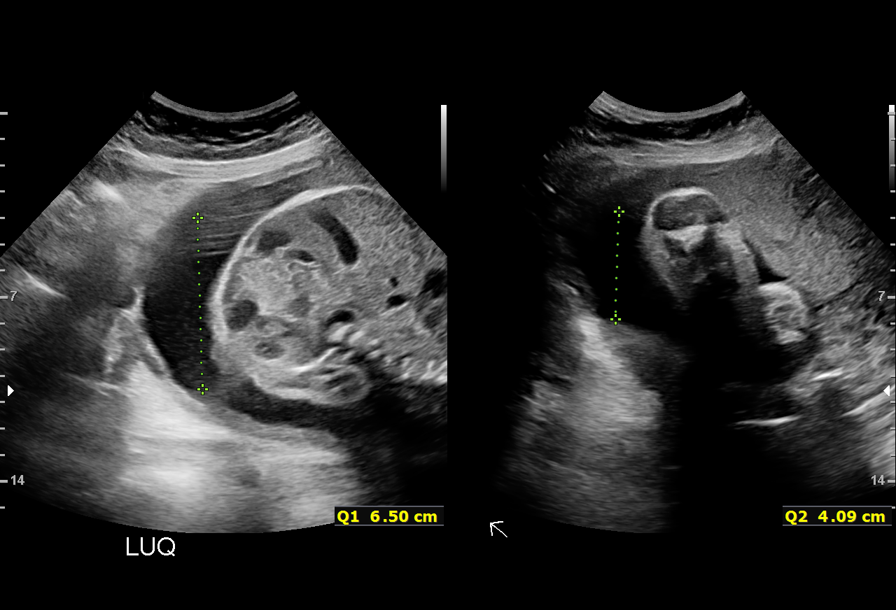
[im 4/10]
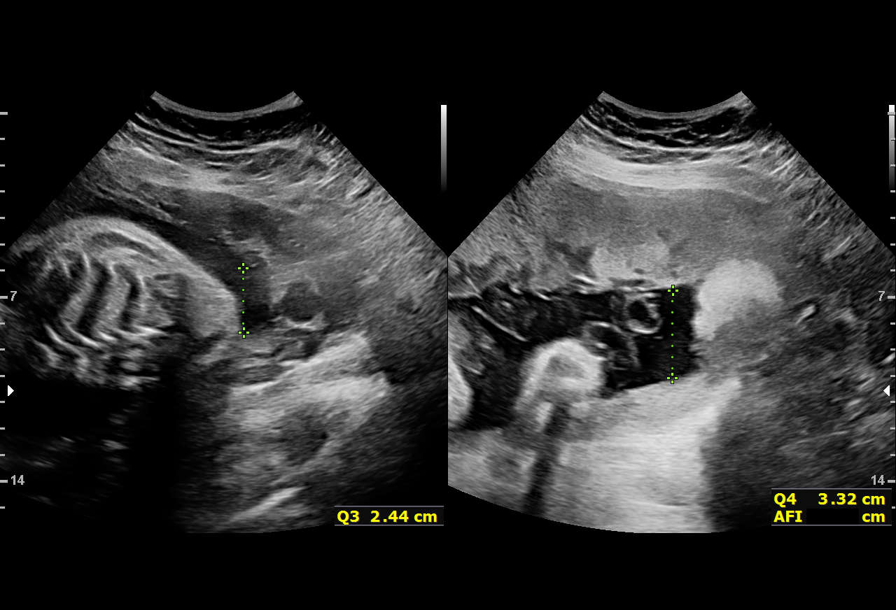
[im 5/10]
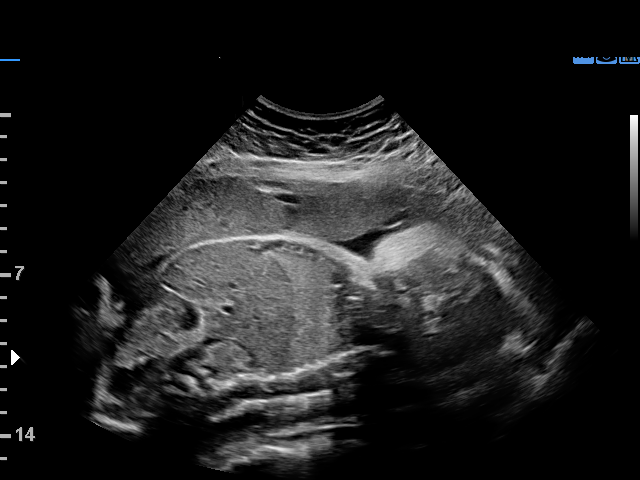
[im 6/10]
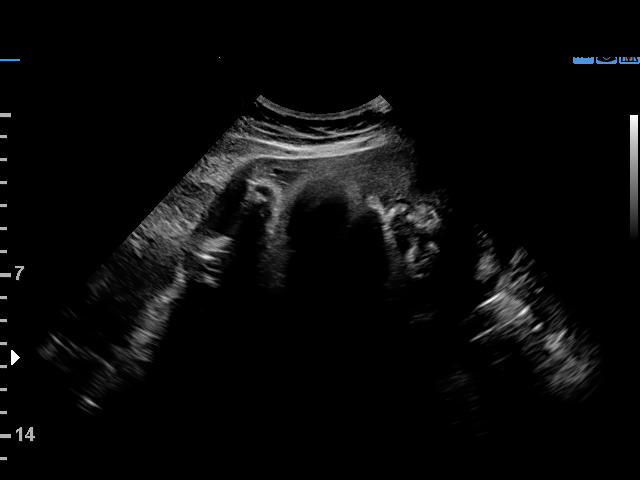
[im 7/10]
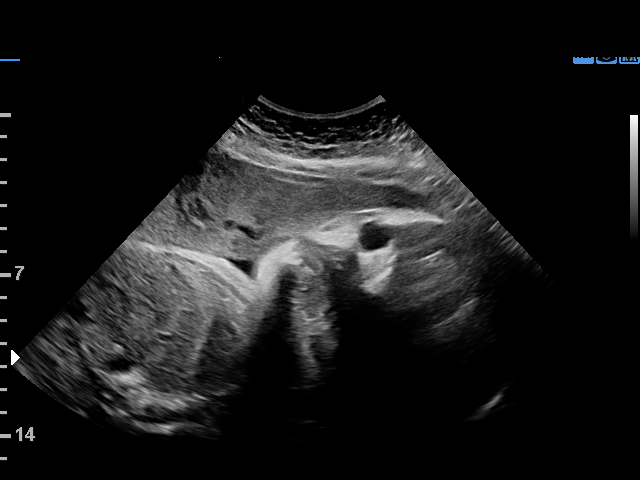
[im 8/10]
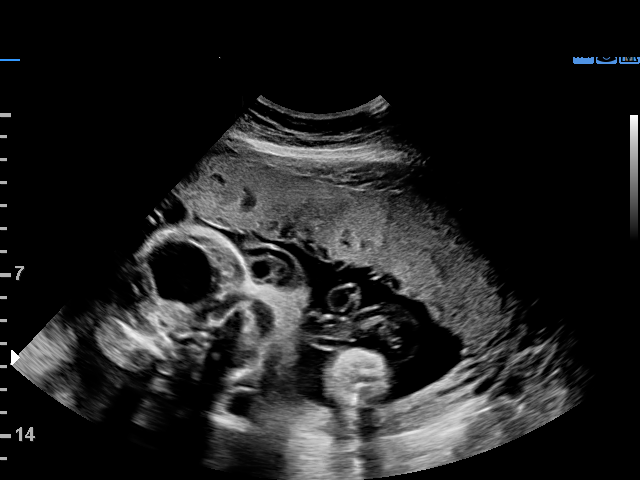
[im 9/10]
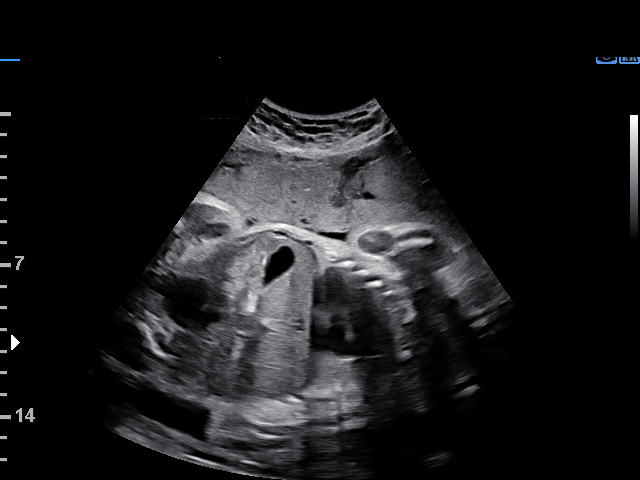
[im 10/10]
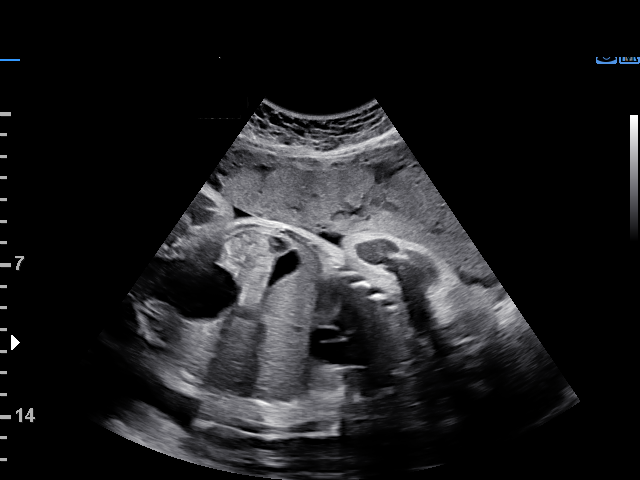

[10 of 10 positions shown; findings below may reference images not displayed]

[REDACTED]
 Attending:        Jaylon Aujla        Secondary Phy.:    [employer]- [employer]
                                                             OB

 ----------------------------------------------------------------------

 ----------------------------------------------------------------------
Indications

  32 weeks gestation of pregnancy
  Premature rupture of membranes - leaking
  fluid
  Poor obstetric history: Previous preterm
  delivery, antepartum (34 weeks)
 ----------------------------------------------------------------------
Vital Signs

                                                Height:        5'4"
Fetal Evaluation

 Num Of Fetuses:          1
 Fetal Heart Rate(bpm):   143
 Cardiac Activity:        Observed
 Presentation:            Cephalic
 Placenta:                Anterior

 Amniotic Fluid
 AFI FV:      Within normal limits

 AFI Sum(cm)     %Tile       Largest Pocket(cm)
 16.35           59

 RUQ(cm)       RLQ(cm)       LUQ(cm)        LLQ(cm)

Biophysical Evaluation

 Amniotic F.V:   Within normal limits       F. Tone:         Observed
 F. Movement:    Observed                   Score:           [DATE]
 F. Breathing:   Not Observed
OB History

 Gravidity:    4         Term:   2        Prem:   0        SAB:   1
 TOP:          0       Ectopic:  0        Living: 2
Gestational Age

 LMP:           32w 3d        Date:  02/28/18                 EDD:   12/05/18
 Clinical EDD:  32w 2d                                        EDD:   12/06/18
 Best:          32w 2d     Det. By:  Clinical EDD             EDD:   12/06/18
Impression

 Patient returned for BPP.
 Amniotic fluid is normal and good fetal activity is seen. Fetal
 breathing movements did not meet the criteria (BPP). BPP
 [DATE].
 I reviewed the NST (OBIX) and it is reactive.
Recommendations

 BPP on [place].
                 Tiger, Macho

## 2022-12-11 ENCOUNTER — Encounter (HOSPITAL_BASED_OUTPATIENT_CLINIC_OR_DEPARTMENT_OTHER): Payer: Self-pay | Admitting: Emergency Medicine

## 2022-12-11 ENCOUNTER — Other Ambulatory Visit: Payer: Self-pay

## 2022-12-11 ENCOUNTER — Emergency Department (HOSPITAL_BASED_OUTPATIENT_CLINIC_OR_DEPARTMENT_OTHER)
Admission: EM | Admit: 2022-12-11 | Discharge: 2022-12-11 | Disposition: A | Discharge status: Home / Self Care | Payer: Managed Care, Other (non HMO) | Attending: Emergency Medicine | Admitting: Emergency Medicine

## 2022-12-11 ENCOUNTER — Emergency Department (HOSPITAL_BASED_OUTPATIENT_CLINIC_OR_DEPARTMENT_OTHER): Payer: Managed Care, Other (non HMO)

## 2022-12-11 DIAGNOSIS — M79631 Pain in right forearm: Secondary | ICD-10-CM | POA: Diagnosis present

## 2022-12-11 DIAGNOSIS — W109XXA Fall (on) (from) unspecified stairs and steps, initial encounter: Secondary | ICD-10-CM | POA: Diagnosis not present

## 2022-12-11 DIAGNOSIS — W108XXA Fall (on) (from) other stairs and steps, initial encounter: Secondary | ICD-10-CM

## 2022-12-11 DIAGNOSIS — S5011XA Contusion of right forearm, initial encounter: Secondary | ICD-10-CM | POA: Insufficient documentation

## 2022-12-11 NOTE — ED Provider Notes (Signed)
[facility] [place] [facility] Provider Note   CSN: [id number] Arrival date & time: 12/11/22  [id number]     History  Chief Complaint  Patient presents with   Alicia Singleton    Alicia Singleton is a 32 y.o. female presents to the ED complaining of right forearm pain and a large bruise following a fall on Friday.  Patient states that she slipped and fell down stairs landing directly on her right arm.  Patient did not have her arm evaluated sooner due to having to work.  She states that she has been "babying" her right arm at work due to having significant pain.  She does not take blood thinners.  She states that she does have some tingling in the arm around the bruise.  Denies neck pain, back pain, numbness, weakness, dizziness.  Denies other injury related to the fall.        Home Medications Prior to Admission medications   Medication Sig Start Date End Date Taking? Authorizing Provider  cetirizine (ZYRTEC) 10 MG tablet Take 10 mg by mouth daily as needed for allergies.    [provider]  cyclobenzaprine (FLEXERIL) 5 MG tablet Take 5 mg by mouth 3 (three) times daily as needed (For tension headache.).  05/15/18   [provider]  Prenatal Vit-Fe Fumarate-FA (PRENATAL MULTIVITAMIN) TABS tablet Take 1 tablet by mouth at bedtime.    [provider]  venlafaxine XR (EFFEXOR-XR) 150 MG 24 hr capsule Take 150 mg by mouth daily with breakfast.  09/21/18   [provider]      Allergies    Patient has no known allergies.    Review of Systems   Review of Systems  Musculoskeletal:  Negative for back pain and neck pain.       Right arm pain with injury  Neurological:  Negative for dizziness, weakness and numbness.    Physical Exam Updated Vital Signs BP (!) 140/88 (BP Location: Left Arm)   Pulse 77   Temp 98 F (36.7 C) (Oral)   Resp 18   Ht '5\' 4"'$  (1.626 m)   Wt 58.5 kg   LMP 11/23/2022 (Exact Date)   SpO2 100%   Breastfeeding No    BMI 22.14 kg/m  Physical Exam Vitals and nursing note reviewed.  Constitutional:      General: She is not in acute distress.    Appearance: Normal appearance. She is not ill-appearing or diaphoretic.  Cardiovascular:     Rate and Rhythm: Normal rate and regular rhythm.  Pulmonary:     Effort: Pulmonary effort is normal.  Musculoskeletal:     Right forearm: Swelling, tenderness and bony tenderness present.     Comments: Swelling and ecchymosis to the posterior right forearm with tenderness to palpation.  Ecchymosis is both purple and yellow in color and appears to be partially healing.  She has normal range of motion of the wrist and digits on the right hand.  Radial pulse 2+.  She does also have a bruise on the proximal right arm.   Neurological:     Mental Status: She is alert. Mental status is at baseline.  Psychiatric:        Mood and Affect: Mood normal.        Behavior: Behavior normal.     ED Results / Procedures / Treatments   Labs (all labs ordered are listed, but only abnormal results are displayed) Labs Reviewed - No data to display  EKG None  Radiology DG Forearm Right  Result Date: 12/11/2022 CLINICAL DATA:  32 year old female with history of trauma from a fall complaining of right forearm pain, numbness and tingling. EXAM: RIGHT FOREARM - 2 VIEW COMPARISON:  No priors. FINDINGS: There is no evidence of fracture or other focal bone lesions. Soft tissues are unremarkable. IMPRESSION: Negative. Electronically Signed   By: Vinnie Langton M.D.   On: 12/11/2022 09:43    Procedures Procedures    Medications Ordered in ED Medications - No data to display  ED Course/ Medical Decision Making/ A&P                             Medical Decision Making Amount and/or Complexity of Data Reviewed Radiology: ordered.   This patient presents to the ED with chief complaint(s) of right forearm injury and pain.  The complaint involves an extensive differential diagnosis  and also carries with it a high risk of complications and morbidity.    The differential diagnosis includes acute bony fracture, soft tissue injury   The initial plan is to obtain x-ray of right forearm  Initial Assessment:   Exam significant for swelling and ecchymosis to the posterior right forearm with tenderness to palpation.  Ecchymosis is both purple and yellow in color and appears to be partially healing.  She has normal range of motion of the wrist and digits on the right hand.  Radial pulses 2+.  She does also have a bruise on the proximal right arm without swelling.  No obvious deformity to the forearm.  Independent visualization and interpretation of imaging: I independently visualized the following imaging with scope of interpretation limited to determining acute life threatening conditions related to emergency care: Right forearm x-ray, which revealed no acute fracture.  I agree with radiologist interpretation.  Disposition:   Patient does not have evidence of an acute bony fracture and likely just sustained significant soft tissue injury.  Discussed supportive care measures including use of cool or warm compresses to the area and Tylenol and/or ibuprofen for pain.  The patient has been appropriately medically screened and/or stabilized in the ED. I have low suspicion for any other emergent medical condition which would require further screening, evaluation or treatment in the ED or require inpatient management. At time of discharge the patient is hemodynamically stable and in no acute distress. I have discussed work-up results and diagnosis with patient and answered all questions. Patient is agreeable with discharge plan. We discussed strict return precautions for returning to the emergency department and they verbalized understanding.            Final Clinical Impression(s) / ED Diagnoses Final diagnoses:  Contusion of right forearm, initial encounter  Fall down stairs,  initial encounter    Rx / DC Orders ED Discharge Orders     None         Pat Kocher, [place] 12/11/22 1008    Wyvonnia Dusky, MD 12/11/22 1022

## 2022-12-11 NOTE — Discharge Instructions (Addendum)
Thank you for allowing me be part of your care today.  Your x-ray was reassuring and showed no broken bones.  I recommend using cool or warm compresses to the area of the bruise.  I also recommend taking ibuprofen or Tylenol for related pain.  You may take 800 mg of ibuprofen or 1000 mg of Tylenol every 6-8 hours as needed.  You may alternate these two medications every 3-4 hours if needed for moderate-severe pain.    I recommend following up with your primary care provider should you continue to have swelling or bruising that is not healing over time.

## 2022-12-11 NOTE — ED Notes (Signed)
Discharge paperwork reviewed entirely with patient, including Rx's and follow up care. Pain was under control. Pt verbalized understanding as well as all parties involved. No questions or concerns voiced at the time of discharge. No acute distress noted.   Pt ambulated out to PVA without incident or assistance.  

## 2022-12-11 NOTE — ED Triage Notes (Signed)
Pt reports fell down steps on Monday. Pt c/o right arm pain with numbness and tingling. Pt presents with bruising to right upper arm.

## 2024-07-03 ENCOUNTER — Emergency Department (HOSPITAL_BASED_OUTPATIENT_CLINIC_OR_DEPARTMENT_OTHER)

## 2024-07-03 ENCOUNTER — Emergency Department (HOSPITAL_BASED_OUTPATIENT_CLINIC_OR_DEPARTMENT_OTHER)
Admission: EM | Admit: 2024-07-03 | Discharge: 2024-07-03 | Disposition: A | Discharge status: Home / Self Care | Attending: Emergency Medicine | Admitting: Emergency Medicine

## 2024-07-03 ENCOUNTER — Encounter (HOSPITAL_BASED_OUTPATIENT_CLINIC_OR_DEPARTMENT_OTHER): Payer: Self-pay | Admitting: Emergency Medicine

## 2024-07-03 ENCOUNTER — Other Ambulatory Visit: Payer: Self-pay

## 2024-07-03 DIAGNOSIS — N83201 Unspecified ovarian cyst, right side: Secondary | ICD-10-CM | POA: Diagnosis not present

## 2024-07-03 DIAGNOSIS — N83209 Unspecified ovarian cyst, unspecified side: Secondary | ICD-10-CM

## 2024-07-03 DIAGNOSIS — R1084 Generalized abdominal pain: Secondary | ICD-10-CM | POA: Diagnosis present

## 2024-07-03 LAB — URINALYSIS, ROUTINE W REFLEX MICROSCOPIC
Bacteria, UA: NONE SEEN
Bilirubin Urine: NEGATIVE
Glucose, UA: NEGATIVE mg/dL
Hgb urine dipstick: NEGATIVE
Ketones, ur: NEGATIVE mg/dL
Nitrite: NEGATIVE
Protein, ur: NEGATIVE mg/dL
Specific Gravity, Urine: 1.009 (ref 1.005–1.030)
pH: 7 (ref 5.0–8.0)

## 2024-07-03 LAB — COMPREHENSIVE METABOLIC PANEL WITH GFR
ALT: 20 U/L (ref 0–44)
AST: 39 U/L (ref 15–41)
Albumin: 4.6 g/dL (ref 3.5–5.0)
Alkaline Phosphatase: 44 U/L (ref 38–126)
Anion gap: 13 (ref 5–15)
BUN: 7 mg/dL (ref 6–20)
CO2: 22 mmol/L (ref 22–32)
Calcium: 9.7 mg/dL (ref 8.9–10.3)
Chloride: 103 mmol/L (ref 98–111)
Creatinine, Ser: 0.74 mg/dL (ref 0.44–1.00)
GFR, Estimated: >60 mL/min (ref >60)
Glucose, Bld: 100 mg/dL — ABNORMAL HIGH (ref 70–99)
Potassium: 3.8 mmol/L (ref 3.5–5.1)
Sodium: 138 mmol/L (ref 135–145)
Total Bilirubin: 0.5 mg/dL (ref 0.0–1.2)
Total Protein: 7.4 g/dL (ref 6.5–8.1)

## 2024-07-03 LAB — CBC
HCT: 39.2 % (ref 36.0–46.0)
Hemoglobin: 13.4 g/dL (ref 12.0–15.0)
MCH: 30.9 pg (ref 26.0–34.0)
MCHC: 34.2 g/dL (ref 30.0–36.0)
MCV: 90.5 fL (ref 80.0–100.0)
Platelets: 286 K/uL (ref 150–400)
RBC: 4.33 MIL/uL (ref 3.87–5.11)
RDW: 11.9 % (ref 11.5–15.5)
WBC: 7.7 K/uL (ref 4.0–10.5)
nRBC: 0 % (ref 0.0–0.2)

## 2024-07-03 LAB — PREGNANCY, URINE: Preg Test, Ur: NEGATIVE

## 2024-07-03 LAB — LIPASE, BLOOD: Lipase: 19 U/L (ref 11–51)

## 2024-07-03 MED ORDER — HYDROCODONE-ACETAMINOPHEN 5-325 MG PO TABS: 1.0000 | ORAL_TABLET | Freq: Four times a day (QID) | ORAL | 0 refills | Status: AC | PRN

## 2024-07-03 MED ORDER — IOHEXOL 300 MG/ML  SOLN
100.0000 mL | Freq: Once | INTRAMUSCULAR | Status: AC | PRN
  Administered 2024-07-03: 100 mL via INTRAVENOUS

## 2024-07-03 NOTE — ED Provider Notes (Signed)
 [place] [facility] AT [facility] Provider Note   CSN: [id number] Arrival date & time: 07/03/24  9056     Patient presents with: Abdominal Pain   Alicia Singleton is a 33 y.o. female with overall noncontributory past medical history who presents concern for right upper to right lower quadrant pain intermittently for couple months.  She feels like there is almost a mass or something in the abdomen, it is more uncomfortable with certain movements.  Worsened after bending and lifting.  She denies any nausea, vomiting, not worse after eating.  She denies any fever, chills, vomiting, diarrhea.  She reports some improvement initially with ibuprofen  but now it feels like ibuprofen  is not working.  At worst her pain is around 7 or 8 out of 10, at this time she reports maybe 4 or 5.    Abdominal Pain      Prior to Admission medications   Medication Sig Start Date End Date Taking? Authorizing Provider  HYDROcodone-acetaminophen  (NORCO/VICODIN) 5-325 MG tablet Take 1 tablet by mouth every 6 (six) hours as needed. 07/03/24  Yes [place] Kirlin H, PA-C  cetirizine (ZYRTEC) 10 MG tablet Take 10 mg by mouth daily as needed for allergies.    [provider]  cyclobenzaprine (FLEXERIL) 5 MG tablet Take 5 mg by mouth 3 (three) times daily as needed (For tension headache.).  05/15/18   [provider]  Prenatal Vit-Fe Fumarate-FA (PRENATAL MULTIVITAMIN) TABS tablet Take 1 tablet by mouth at bedtime.    [provider]  venlafaxine  XR (EFFEXOR -XR) 150 MG 24 hr capsule Take 150 mg by mouth daily with breakfast.  09/21/18   [provider]    Allergies: Patient has no known allergies.    Review of Systems  Gastrointestinal:  Positive for abdominal pain.  All other systems reviewed and are negative.   Updated Vital Signs BP (!) 141/69 (BP Location: Right Arm)   Pulse 88   Temp 98.6 F (37 C) (Oral)   Resp 18   SpO2 100%   Physical  Exam Vitals and nursing note reviewed.  Constitutional:      General: She is not in acute distress.    Appearance: Normal appearance.  HENT:     Head: Normocephalic and atraumatic.  Eyes:     General:        Right eye: No discharge.        Left eye: No discharge.  Cardiovascular:     Rate and Rhythm: Normal rate and regular rhythm.     Heart sounds: No murmur heard.    No friction rub. No gallop.  Pulmonary:     Effort: Pulmonary effort is normal.     Breath sounds: Normal breath sounds.  Abdominal:     General: Bowel sounds are normal.     Palpations: Abdomen is soft.     Comments: Diffuse tenderness to palpation on the right lateral side, no significant Murphy's sign, negative McBurney's point tenderness, but general discomfort on palpation throughout the right abdomen.  Skin:    General: Skin is warm and dry.     Capillary Refill: Capillary refill takes less than 2 seconds.  Neurological:     Mental Status: She is alert and oriented to person, place, and time.  Psychiatric:        Mood and Affect: Mood normal.        Behavior: Behavior normal.     (all labs ordered are listed, but only abnormal results are displayed)  Labs Reviewed  COMPREHENSIVE METABOLIC PANEL WITH GFR - Abnormal; Notable for the following components:      Result Value   Glucose, Bld 100 (*)    All other components within normal limits  URINALYSIS, ROUTINE W REFLEX MICROSCOPIC - Abnormal; Notable for the following components:   Color, Urine COLORLESS (*)    Leukocytes,Ua SMALL (*)    All other components within normal limits  LIPASE, BLOOD  CBC  PREGNANCY, URINE    EKG: None  Radiology: CT ABDOMEN PELVIS W CONTRAST Result Date: 07/03/2024 CLINICAL DATA:  Right lower quadrant abdominal pain for several months. EXAM: CT ABDOMEN AND PELVIS WITH CONTRAST TECHNIQUE: Multidetector CT imaging of the abdomen and pelvis was performed using the standard protocol following bolus administration of  intravenous contrast. RADIATION DOSE REDUCTION: This exam was performed according to the departmental dose-optimization program which includes automated exposure control, adjustment of the mA and/or kV according to patient size and/or use of iterative reconstruction technique. CONTRAST:  100mL OMNIPAQUE IOHEXOL 300 MG/ML  SOLN COMPARISON:  None Available. FINDINGS: Lower chest: No acute abnormality. Hepatobiliary: No focal liver abnormality is seen. No gallstones, gallbladder wall thickening, or biliary dilatation. Pancreas: Unremarkable. No pancreatic ductal dilatation or surrounding inflammatory changes. Spleen: Normal in size without focal abnormality. Adrenals/Urinary Tract: Adrenal glands are unremarkable. Kidneys are normal, without renal calculi, focal lesion, or hydronephrosis. Bladder is unremarkable. Stomach/Bowel: Stomach is within normal limits. Appendix appears normal. No evidence of bowel wall thickening, distention, or inflammatory changes. Vascular/Lymphatic: No significant vascular findings are present. No enlarged abdominal or pelvic lymph nodes. Reproductive: Uterus is unremarkable. Probable ruptured or involuting right ovarian cyst is noted with small amount of free fluid in the right adnexal region. Ligation clip is seen in left adnexal region, while another ligation clip has migrated more superiorly to the left lower quadrant of the abdomen. Other: Small fat containing periumbilical hernia. Musculoskeletal: No acute or significant osseous findings. IMPRESSION: 1. Probable ruptured or involuting right ovarian cyst is noted with small amount of free fluid in the right adnexal region. 2. Fallopian tube ligation clip appears to have migrated more superiorly into left lower quadrant of abdomen. 3. Small fat containing periumbilical hernia. Electronically Signed   By: Lynwood Landy Raddle M.D.   On: 07/03/2024 11:46     Procedures   Medications Ordered in the ED  iohexol (OMNIPAQUE) 300 MG/ML  solution 100 mL (100 mLs Intravenous Contrast Given 07/03/24 1125)                                    Medical Decision Making Amount and/or Complexity of Data Reviewed Labs: ordered. Radiology: ordered.  Risk Prescription drug management.   This patient is a 33 y.o. female  who presents to the ED for concern of abdominal pain, rlq pain.   Differential diagnoses prior to evaluation: The emergent differential diagnosis includes, but is not limited to,  The causes of generalized abdominal pain include but are not limited to AAA, mesenteric ischemia, appendicitis, diverticulitis, DKA, gastritis, gastroenteritis, AMI, nephrolithiasis, pancreatitis, peritonitis, adrenal insufficiency,lead poisoning, iron toxicity, intestinal ischemia, constipation, UTI,SBO/LBO, splenic rupture, biliary disease, IBD, IBS, PUD, or hepatitis, Ectopic pregnancy, ovarian torsion, PID. SABRA This is not an exhaustive differential.   Past Medical History / Co-morbidities / Social History: Previous tubal ligation, otherwise overall noncontributory  Physical Exam: Physical exam performed. The pertinent findings include: Diffuse.  Right lower abdomen to right  upper quadrant tenderness, no rebound, rigidity, guarding, no severe adnexal tenderness, negative McBurney's point tenderness, negative Murphy sign.  Lab Tests/Imaging studies: I personally interpreted labs/imaging and the pertinent results include: CBC unremarkable, normal lipase, negative serum pregnancy test, CMP unremarkable, UA unremarkable, no evidence of urinary tract infection.  CT abdomen/pelvis with contrast shows possible ruptured versus involuting right ovarian cyst, some free fluid.  Clinically based on her exam I do think that a ruptured cyst is consistent with what is likely source of her pain, I do not feel with no severe adnexal tenderness on exam that recurrent ovarian torsion is likely, but discussed extensive return precautions if pain worsens in this  area. I agree with the radiologist interpretation.   Medications: encouraged ibuprofen , Tylenol , short course of as needed pain medication, and encourage close OB/GYN follow-up.  I have reviewed the patients home medicines and have made adjustments as needed.   Disposition: After consideration of the diagnostic results and the patients response to treatment, I feel that involuting ovarian cyst versus ovarian rupture is consistent with explanation of patient's pain, encouraged pain control as above, close follow-up with OB/GYN, emergency return precautions given.   emergency department workup does not suggest an emergent condition requiring admission or immediate intervention beyond what has been performed at this time. The plan is: as above. The patient is safe for discharge and has been instructed to return immediately for worsening symptoms, change in symptoms or any other concerns.   Final diagnoses:  Ruptured ovarian cyst    ED Discharge Orders          Ordered    HYDROcodone-acetaminophen  (NORCO/VICODIN) 5-325 MG tablet  Every 6 hours PRN        07/03/24 1205               Anyjah Roundtree, [place] H, PA-C 07/03/24 1207    Freddi Hamilton, MD 07/03/24 1428

## 2024-07-03 NOTE — Discharge Instructions (Signed)
 Please use Tylenol  or ibuprofen  for pain.  You may use 600 mg ibuprofen  every 6 hours or 1000 mg of Tylenol  every 6 hours.  You may choose to alternate between the 2.  This would be most effective.  Not to exceed 4 g of Tylenol  within 24 hours.  Not to exceed 3200 mg ibuprofen  24 hours.  You can use the stronger narcotic pain medication in place of Tylenol  for severe break through pain.  If you take the narcotic pain medication that we prescribed recommend that you also take a laxative such as MiraLAX or Dulcolax every day that you take the narcotic pain medicine, and drink plenty of fluids, 50 to 64 ounces to prevent any constipation.  Follow-up closely with your OB/GYN, if you have severe worsening pain I would return to the emergency department for an ultrasound to ensure that you are not developing something like an ovarian torsion.

## 2024-07-03 NOTE — ED Triage Notes (Signed)
 C/o RLQ x couple months. States feels like a child is in my ribs

## 2024-08-31 ENCOUNTER — Other Ambulatory Visit: Payer: Self-pay

## 2024-08-31 ENCOUNTER — Emergency Department (HOSPITAL_BASED_OUTPATIENT_CLINIC_OR_DEPARTMENT_OTHER)

## 2024-08-31 ENCOUNTER — Emergency Department (HOSPITAL_BASED_OUTPATIENT_CLINIC_OR_DEPARTMENT_OTHER)
Admission: EM | Admit: 2024-08-31 | Discharge: 2024-08-31 | Disposition: A | Discharge status: Home / Self Care | Attending: Emergency Medicine | Admitting: Emergency Medicine

## 2024-08-31 ENCOUNTER — Encounter (HOSPITAL_BASED_OUTPATIENT_CLINIC_OR_DEPARTMENT_OTHER): Payer: Self-pay | Admitting: Emergency Medicine

## 2024-08-31 DIAGNOSIS — R0789 Other chest pain: Secondary | ICD-10-CM | POA: Diagnosis present

## 2024-08-31 DIAGNOSIS — R002 Palpitations: Secondary | ICD-10-CM | POA: Insufficient documentation

## 2024-08-31 DIAGNOSIS — E876 Hypokalemia: Secondary | ICD-10-CM | POA: Diagnosis not present

## 2024-08-31 DIAGNOSIS — R079 Chest pain, unspecified: Secondary | ICD-10-CM

## 2024-08-31 LAB — CBC WITH DIFFERENTIAL/PLATELET
Abs Immature Granulocytes: 0.03 K/uL (ref 0.00–0.07)
Basophils Absolute: 0.1 K/uL (ref 0.0–0.1)
Basophils Relative: 1 %
Eosinophils Absolute: 0.4 K/uL (ref 0.0–0.5)
Eosinophils Relative: 4 %
HCT: 41 % (ref 36.0–46.0)
Hemoglobin: 14.3 g/dL (ref 12.0–15.0)
Immature Granulocytes: 0 %
Lymphocytes Relative: 41 %
Lymphs Abs: 3.8 K/uL (ref 0.7–4.0)
MCH: 31.2 pg (ref 26.0–34.0)
MCHC: 34.9 g/dL (ref 30.0–36.0)
MCV: 89.3 fL (ref 80.0–100.0)
Monocytes Absolute: 0.8 K/uL (ref 0.1–1.0)
Monocytes Relative: 9 %
Neutro Abs: 4.1 K/uL (ref 1.7–7.7)
Neutrophils Relative %: 45 %
Platelets: 310 K/uL (ref 150–400)
RBC: 4.59 MIL/uL (ref 3.87–5.11)
RDW: 12.2 % (ref 11.5–15.5)
WBC: 9.3 K/uL (ref 4.0–10.5)
nRBC: 0 % (ref 0.0–0.2)

## 2024-08-31 LAB — D-DIMER, QUANTITATIVE: D-Dimer, Quant: <0.27 ug{FEU}/mL (ref 0.00–0.50)

## 2024-08-31 LAB — BASIC METABOLIC PANEL WITH GFR
Anion gap: 11 (ref 5–15)
BUN: 9 mg/dL (ref 6–20)
CO2: 25 mmol/L (ref 22–32)
Calcium: 9.2 mg/dL (ref 8.9–10.3)
Chloride: 103 mmol/L (ref 98–111)
Creatinine, Ser: 0.72 mg/dL (ref 0.44–1.00)
GFR, Estimated: >60 mL/min (ref >60)
Glucose, Bld: 99 mg/dL (ref 70–99)
Potassium: 3.2 mmol/L — ABNORMAL LOW (ref 3.5–5.1)
Sodium: 139 mmol/L (ref 135–145)

## 2024-08-31 LAB — HCG, SERUM, QUALITATIVE: Preg, Serum: NEGATIVE

## 2024-08-31 LAB — TROPONIN T, HIGH SENSITIVITY: Troponin T High Sensitivity: <15 ng/L (ref 0–19)

## 2024-08-31 MED ORDER — IOHEXOL 350 MG/ML SOLN
75.0000 mL | Freq: Once | INTRAVENOUS | Status: AC | PRN
  Administered 2024-08-31: 75 mL via INTRAVENOUS

## 2024-08-31 MED ORDER — KETOROLAC TROMETHAMINE 30 MG/ML IJ SOLN
30.0000 mg | Freq: Once | INTRAMUSCULAR | Status: AC
  Administered 2024-08-31: 30 mg via INTRAVENOUS
  Filled 2024-08-31: qty 1

## 2024-08-31 MED ORDER — POTASSIUM CHLORIDE CRYS ER 20 MEQ PO TBCR
40.0000 meq | EXTENDED_RELEASE_TABLET | Freq: Once | ORAL | Status: AC
  Administered 2024-08-31: 40 meq via ORAL
  Filled 2024-08-31: qty 2

## 2024-08-31 NOTE — ED Notes (Signed)
 Patient transported to X-ray

## 2024-08-31 NOTE — Discharge Instructions (Addendum)
 It was a pleasure caring for you today in the emergency department.  Please call your PCP to arrange follow up in the office in the next week  Return to the Emergency Department if you have unusual chest pain, pressure, or discomfort, shortness of breath, nausea, vomiting, burping, heartburn, tingling upper body parts, sweating, cold, clammy skin, or racing heartbeat. Call 911 if you think you are having a heart attack. Follow cardiac diet - avoid fatty & fried foods, don't eat too much red meat, eat lots of fruits & vegetables, and dairy products should be low fat. Please lose weight if you are overweight. Become more active with walking, gardening, or any other activity that gets you to moving.   Please return to the emergency department immediately for any new or concerning symptoms, or if you get worse.

## 2024-08-31 NOTE — ED Provider Notes (Signed)
  Provider Note MRN:  [id number]  Arrival date & time: 08/31/24    ED Course and Medical Decision Making  Assumed care from Dr Geroldine at shift change.  See note from prior team for complete details, in brief:  Clinical Course as of 08/31/24 0802  Sat Aug 31, 2024  0708 Handoff DD 33 y/o f Here w/ chest pain, tachycardia K 3.2 Trop neg Dimer neg Cxr neg Pending CTAP  [SG]    Clinical Course User Index [SG] Elnor Jayson LABOR, DO     Plan per prior physician follow-up on CT   CT PE has resulted, no evidence of PE or other acute abnormalities Labs otherwise are stable, potassium is mildly low, will replace orally Heart rate has improved> HR elevation noted after entering the room and when discussing pt care, before/after entering the room HR NSR.  Likely atypical chest pain  The patient's chest pain is not suggestive of pulmonary embolus, cardiac ischemia, aortic dissection, pericarditis, myocarditis, pulmonary embolism, pneumothorax, pneumonia, Zoster, or esophageal perforation, or other serious etiology.  Historically not abrupt in onset, tearing or ripping, pulses symmetric. EKG nonspecific for ischemia/infarction. No dysrhythmias, brugada, WPW, prolonged QT noted.   Troponin negative. CXR reviewed. Labs without demonstration of acute pathology unless otherwise noted above. Low HEART Score: 0-3 points (0.9-1.7% risk of MACE).]   Given the extremely low risk of these diagnoses further testing and evaluation for these possibilities does not appear to be indicated at this time. Patient in no distress and overall condition improved here in the ED. Detailed discussions were had with the patient regarding current findings, and need for close f/u with PCP or on call doctor. The patient has been instructed to return immediately if the symptoms worsen in any way for re-evaluation. Patient verbalized understanding and is in agreement with current care plan. All questions answered prior to  discharge.    Procedures  Final Clinical Impressions(s) / ED Diagnoses     ICD-10-CM   1. Chest pain with low risk of acute coronary syndrome  R07.9     2. Hypokalemia  E87.6     3. Palpitations  R00.2       ED Discharge Orders     None         Discharge Instructions      It was a pleasure caring for you today in the emergency department.  Please call your PCP to arrange follow up in the office in the next week  Return to the Emergency Department if you have unusual chest pain, pressure, or discomfort, shortness of breath, nausea, vomiting, burping, heartburn, tingling upper body parts, sweating, cold, clammy skin, or racing heartbeat. Call 911 if you think you are having a heart attack. Follow cardiac diet - avoid fatty & fried foods, don't eat too much red meat, eat lots of fruits & vegetables, and dairy products should be low fat. Please lose weight if you are overweight. Become more active with walking, gardening, or any other activity that gets you to moving.   Please return to the emergency department immediately for any new or concerning symptoms, or if you get worse.          Elnor Jayson LABOR, DO 08/31/24 [phone]

## 2024-08-31 NOTE — ED Provider Notes (Signed)
 Alicia Singleton Provider Note   CSN: [id number] Arrival date & time: 08/31/24  9443     Patient presents with: Chest Pain and Tachycardia   Alicia Singleton is a 33 y.o. female.  {Add pertinent medical, surgical, social history, OB history to [id number]} Patient is a 33 year old female presenting with complaints of chest pain.  She describes a several day history of intermittent discomfort in the center of her chest radiating to her left chest.  This is sharp in nature.  No shortness of breath, but does feel as though she cannot take a full breath and finds it difficult to yawn.  No fevers or chills.  No productive cough.  No leg swelling or calf pain.       Prior to Admission medications   Medication Sig Start Date End Date Taking? Authorizing Provider  cetirizine (ZYRTEC) 10 MG tablet Take 10 mg by mouth daily as needed for allergies.    [provider]  cyclobenzaprine (FLEXERIL) 5 MG tablet Take 5 mg by mouth 3 (three) times daily as needed (For tension headache.).  05/15/18   [provider]  HYDROcodone -acetaminophen  (NORCO/VICODIN) 5-325 MG tablet Take 1 tablet by mouth every 6 (six) hours as needed. 07/03/24   Singleton, Alicia H, PA-C  Prenatal Vit-Fe Fumarate-FA (PRENATAL MULTIVITAMIN) TABS tablet Take 1 tablet by mouth at bedtime.    [provider]  venlafaxine  XR (EFFEXOR -XR) 150 MG 24 hr capsule Take 150 mg by mouth daily with breakfast.  09/21/18   [provider]    Allergies: Patient has no known allergies.    Review of Systems  All other systems reviewed and are negative.   Updated Vital Signs BP (!) 142/91 (BP Location: Right Arm)   Pulse (!) 101   Temp 98.7 F (37.1 C) (Oral)   Resp 18   Ht 5' 4 (1.626 m)   Wt 59 kg   LMP 07/14/2024 (Approximate)   SpO2 100%   BMI 22.31 kg/m   Physical Exam Vitals and nursing note reviewed.  Constitutional:      General: She is not in  acute distress.    Appearance: She is well-developed. She is not diaphoretic.  HENT:     Head: Normocephalic and atraumatic.  Cardiovascular:     Rate and Rhythm: Normal rate and regular rhythm.     Heart sounds: No murmur heard.    No friction rub. No gallop.  Pulmonary:     Effort: Pulmonary effort is normal. No respiratory distress.     Breath sounds: Normal breath sounds. No wheezing.  Abdominal:     General: Bowel sounds are normal. There is no distension.     Palpations: Abdomen is soft.     Tenderness: There is no abdominal tenderness.  Musculoskeletal:        General: Normal range of motion.     Cervical back: Normal range of motion and neck supple.     Right lower leg: No tenderness. No edema.     Left lower leg: No tenderness. No edema.  Skin:    General: Skin is warm and dry.  Neurological:     General: No focal deficit present.     Mental Status: She is alert and oriented to person, place, and time.     (all labs ordered are listed, but only abnormal results are displayed) Labs Reviewed  BASIC METABOLIC PANEL WITH GFR  CBC WITH DIFFERENTIAL/PLATELET  D-DIMER, QUANTITATIVE  TROPONIN T,  HIGH SENSITIVITY    EKG: EKG Interpretation Date/Time:  Saturday August 31 2024 06:08:20 EST Ventricular Rate:  122 PR Interval:  161 QRS Duration:  74 QT Interval:  313 QTC Calculation: 446 R Axis:   93  Text Interpretation: Sinus tachycardia Consider left atrial enlargement Borderline right axis deviation Borderline T wave abnormalities Confirmed by Alicia Singleton ([id number]) on 08/31/2024 6:12:06 AM  Radiology: No results found.  {Document cardiac monitor, telemetry assessment procedure when appropriate:32947} Procedures   Medications Ordered in the ED  ketorolac (TORADOL) 30 MG/ML injection 30 mg (has no administration in time range)    Clinical Course as of 08/31/24 0710  Sat Aug 31, 2024  0708 Handoff DD 32 y/o f Here w/ chest pain, tachycardia K 3.2 Trop  neg Dimer neg Cxr neg  [SG]    Clinical Course User Index [SG] Elnor Jayson LABOR, DO   {Click here for ABCD2, HEART and other calculators REFRESH Note before signing:1}                              Medical Decision Making Amount and/or Complexity of Data Reviewed Labs: ordered. Radiology: ordered.  Risk Prescription drug management.   ***  {Document critical care time when appropriate  Document review of labs and clinical decision tools ie CHADS2VASC2, etc  Document your independent review of radiology images and any outside records  Document your discussion with family members, caretakers and with consultants  Document social determinants of health affecting pt's care  Document your decision making why or why not admission, treatments were needed:32947:::1}   Final diagnoses:  None    ED Discharge Orders     None

## 2024-08-31 NOTE — ED Triage Notes (Signed)
 Central chest pain, radiates bilateral chest worse on left side. States feels like need to yawn but can't. Off and on 2days.
